# Patient Record
Sex: Female | Born: 1965 | Hispanic: No | Marital: Single | Smoking: Never smoker
Health system: Southern US, Community
[De-identification: ages and names within clinical notes are randomized; demographics above are authoritative.]

## PROBLEM LIST (undated history)

## (undated) DIAGNOSIS — Z5900 Homelessness unspecified: Secondary | ICD-10-CM

## (undated) DIAGNOSIS — K8020 Calculus of gallbladder without cholecystitis without obstruction: Secondary | ICD-10-CM

## (undated) DIAGNOSIS — F418 Other specified anxiety disorders: Secondary | ICD-10-CM

## (undated) DIAGNOSIS — I2699 Other pulmonary embolism without acute cor pulmonale: Secondary | ICD-10-CM

## (undated) DIAGNOSIS — K5792 Diverticulitis of intestine, part unspecified, without perforation or abscess without bleeding: Secondary | ICD-10-CM

## (undated) DIAGNOSIS — N809 Endometriosis, unspecified: Secondary | ICD-10-CM

## (undated) DIAGNOSIS — G4733 Obstructive sleep apnea (adult) (pediatric): Secondary | ICD-10-CM

## (undated) DIAGNOSIS — R569 Unspecified convulsions: Secondary | ICD-10-CM

## (undated) DIAGNOSIS — I10 Essential (primary) hypertension: Secondary | ICD-10-CM

## (undated) DIAGNOSIS — E669 Obesity, unspecified: Secondary | ICD-10-CM

## (undated) HISTORY — PX: CYSTOSCOPY: SUR368

## (undated) HISTORY — PX: COLONOSCOPY: SHX174

## (undated) HISTORY — PX: APPENDECTOMY: SHX54

## (undated) HISTORY — PX: ABDOMINAL HYSTERECTOMY: SHX81

---

## 2017-11-06 DIAGNOSIS — E782 Mixed hyperlipidemia: Secondary | ICD-10-CM | POA: Insufficient documentation

## 2017-11-08 ENCOUNTER — Other Ambulatory Visit: Payer: Self-pay | Admitting: Family Medicine

## 2017-11-08 DIAGNOSIS — Z1231 Encounter for screening mammogram for malignant neoplasm of breast: Secondary | ICD-10-CM

## 2017-11-21 ENCOUNTER — Ambulatory Visit
Admission: RE | Admit: 2017-11-21 | Discharge: 2017-11-21 | Disposition: A | Discharge status: Home / Self Care | Payer: 59 | Source: Ambulatory Visit | Attending: Family Medicine | Admitting: Family Medicine

## 2017-11-21 ENCOUNTER — Encounter (INDEPENDENT_AMBULATORY_CARE_PROVIDER_SITE_OTHER): Payer: Self-pay

## 2017-11-21 DIAGNOSIS — Z1231 Encounter for screening mammogram for malignant neoplasm of breast: Secondary | ICD-10-CM | POA: Insufficient documentation

## 2017-11-22 DIAGNOSIS — G4733 Obstructive sleep apnea (adult) (pediatric): Secondary | ICD-10-CM | POA: Insufficient documentation

## 2017-12-03 ENCOUNTER — Encounter: Payer: 59 | Attending: Family Medicine | Admitting: Dietician

## 2017-12-03 DIAGNOSIS — R635 Abnormal weight gain: Secondary | ICD-10-CM | POA: Diagnosis present

## 2017-12-03 NOTE — Patient Instructions (Addendum)
Work to establish 3 meals per day spaced 4-5 hours apart. If there is 6 hours between a meal, include a small snack.  Balance meals with protein, 2-4 servings of carbohydrate (starch, fruit, yogurt) and non-starchy vegetables. Use food guide plate as a guide.  Read labels for carbohydrate.  Be sure to look at the serving size on the package.

## 2017-12-03 NOTE — Progress Notes (Signed)
Medical Nutrition Therapy: Visit start time:1430 end time: 1530 Assessment:  Diagnosis: abnormal weight gain Past medical history: elevated cholesterol- T.Chol.-218; LDL- 142 Psychosocial issues/ stress concerns: Patient rates her stress as high related to her work. She denies depression.  Preferred learning method:  . Auditory . Visual . Hands-on Current weight: 272.2 lbs Height: 70 in Medications, supplements: no prescription meds; takes rosemary extract, flaxseed oil, salmon oil  Progress and evaluation:  Patient in for initial medical nutrition therapy appointment. 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 She reports a 20 lb weight gain in the past year. She gives a weight goal of 190 lbs which she weighed approximately 15 years ago. She eats a large breakfast at 5:30am such as 4 eggs, toast or 2 cups oatmeal and doesn't typically eat a lunch due to busy workload. She then eats dinner a 5:30 pm and acknowledges that portion control is difficult due to not eating during the day. She prepares dinner for the residents with examples of baked chicken, rice with vegetables or butternut squash spaghetti with red skin potatoes.She gave another example of 10 seasoned chicken wings and vegetables. Her evening snack is sometimes yogurt or something sweet such as 10 mini oreos. She states she drinks 50 oz water daily; 2 cups of coffee; soda-2x/week. Eats "out" 2 x per week.  Physical activity: Had been working out at [employer] 3 days/week for 1 hour; hurt her foot and rates pain as a "9". Has an orthopedic appointment 12/4.  Nutrition Care Education: Basic nutrition:  Instructed on food groups needed to meet basic nutrient needs. Weight control: Instructed on a meal plan to help improve eating habits including identifying carbohydrate foods, portion control and how to better balance  protein, carbohydrate and non starchy vegetables. Gave and reviewed meal/menu examples. Stressed need to take some time to eat lunch and discussed options. Encouraged to use meal plan as a guide for mindful eating verses over-restriction. Commended on present exercise habits and stressed exercise is a key in weight management.   Nutritional Diagnosis:  NB-1.1 Food and nutrition-related knowledge deficit As related to knowledge of how to balance nutrients at meals.  As evidenced by diet history..  Intervention: Work to establish 3 meals per day spaced 4-5 hours apart. If there is 6 hours between a meal, include a small snack.  Balance meals with protein, 2-4 servings of carbohydrate (starch, fruit, yogurt) and non-starchy vegetables. Use food guide plate as a guide.  Read labels for carbohydrate.  Be sure to look at the serving size on the package Education Materials given:  . Plate Planner . Food lists/ Planning A Balanced Meal . Sample meal pattern/ menus . Goals/ instructions . Quick and Healthy Meals . Balanced meal suggestions  Learner/ who was taught:  . Patient  Level of understanding: . Partial understanding; needs review/ practice  Demonstrated degree of understanding via:   Teach back Learning barriers: . None Willingness to learn/ readiness for change:  Eager; ready to make changes Monitoring and Evaluation:  Dietary intake, exercise, , and body weight. Will also focus more on guidelines for lowering cholesterol at follow-up visit.      follow up: 12/30/17 at 10:30am

## 2017-12-16 DIAGNOSIS — B351 Tinea unguium: Secondary | ICD-10-CM | POA: Insufficient documentation

## 2017-12-16 DIAGNOSIS — M2012 Hallux valgus (acquired), left foot: Secondary | ICD-10-CM | POA: Insufficient documentation

## 2017-12-16 DIAGNOSIS — L851 Acquired keratosis [keratoderma] palmaris et plantaris: Secondary | ICD-10-CM | POA: Insufficient documentation

## 2017-12-16 DIAGNOSIS — M2041 Other hammer toe(s) (acquired), right foot: Secondary | ICD-10-CM | POA: Insufficient documentation

## 2017-12-30 ENCOUNTER — Ambulatory Visit: Payer: 59 | Admitting: Dietician

## 2018-01-09 ENCOUNTER — Encounter: Payer: Self-pay | Admitting: Dietician

## 2018-02-21 ENCOUNTER — Telehealth: Payer: Self-pay | Admitting: Dietician

## 2018-02-21 NOTE — Telephone Encounter (Signed)
Patient called to schedule a follow-up visit, was seen 12/2017 for MNT. She reports attempting to schedule a follow-up earlier in the year, but never received a call back. Apologized for the error, and scheduled patient for 03/06/18.

## 2018-02-26 ENCOUNTER — Telehealth: Payer: Self-pay | Admitting: Dietician

## 2018-02-26 NOTE — Telephone Encounter (Signed)
Returned message from patient to reschedule her upcoming appointment on 03/06/18 to the following week. She rescheduled for 03/12/18 at 10:15am.

## 2018-03-06 ENCOUNTER — Ambulatory Visit: Payer: 59 | Admitting: Dietician

## 2018-03-12 ENCOUNTER — Encounter: Payer: 59 | Attending: Family Medicine | Admitting: Dietician

## 2018-03-12 ENCOUNTER — Encounter: Payer: Self-pay | Admitting: Dietician

## 2018-03-12 ENCOUNTER — Other Ambulatory Visit: Payer: Self-pay

## 2018-03-12 VITALS — Ht 72.0 in | Wt 275.0 lb

## 2018-03-12 DIAGNOSIS — R635 Abnormal weight gain: Secondary | ICD-10-CM | POA: Insufficient documentation

## 2018-03-12 NOTE — Progress Notes (Signed)
Medical Nutrition Therapy: Visit start time: 1015  end time: 1115  Assessment:  Diagnosis: obesity Medical history changes: no changes since previous visit on 12/03/17 per patient Psychosocial issues/ stress concerns: high stress level, work related  Current weight: 275.0lbs  Height: 6'0" Medications, supplement changes: no changes per patient  Progress and evaluation:   Patient reports continued high stress level mostly related to her work.   She has started using a C-pap machine as of last night and does feel more rested, hopeful it will continue.   Weight has increased by 3lbs since previous visit on 12/03/17. Patient has made some diet changes and feels ready to devote more effort to self-care now.   Physical activity: gym about 3x a week-- but not consistent; some PT exercises  Dietary Intake:  Usual eating pattern includes 2 meals and 1-2 snacks per day. Dining out frequency: not assessed today.  Breakfast: steel cut oatmeal with sm amount whole milk, cinnamon, less sugar; 1x a week feta cheese wrap or cheese danish at [employer]: none Lunch: sometimes none, or snack; usually not hungry Snack: none Supper: 5- 5:15pm- salad spinach with chicken or salmon with small amount dressing; occ burger (patients want burgers), usually fresh veg ie sweet potatoes Snack: snack pack of mini oreos or yogurt Activia Beverages: water with lemon; occasional coffee or green tea  Nutrition Care Education: Topics covered: weight control Basic nutrition: basic food groups, appropriate nutrient balance, appropriate meal and snack schedule, general nutrition guidelines    Weight control: benefits of weight control, calculated energy needs for weight loss at about 1800kcal daily and instructed on basic meal planning using plate method; importance of low fat and low sugar choices, appropriate food portions, benefits of tracking food intake, role of physical activity. Advanced nutrition: dining  out  Nutritional Diagnosis:  Thayer-3.3 Overweight/obesity As related to history of excess calories and inactivity, and stress.  As evidenced by patient wtih current BMI of 37.3 and patient report of diet and activity history.  Intervention:   Discussion and instruction as noted above.  Patient is making healthy food choices in general.   Established goals to further reduce calorie intake, with direction from patient.  Education Materials given:  . Plate Planner with food lists . Sample meal pattern/ menus . Goals/ instructions   Learner/ who was taught:  . Patient   Level of understanding: Marland Kitchen Verbalizes/ demonstrates competency   Demonstrated degree of understanding via:   Teach back Learning barriers: . None  Willingness to learn/ readiness for change: . Eager, change in progress  Monitoring and Evaluation:  Dietary intake, exercise, and body weight      follow up: 04/09/18

## 2018-03-12 NOTE — Patient Instructions (Addendum)
   Continue with making healthy vegetable choices, great job!  Monitor food portions especially starches; plan to eat 1/2 portions when dining out.  Include a protein source with each meal; try boiled egg(s)   Keep a food diary of foods and amounts eaten each day to stay aware of intake.   Include exercise when able -- increase time and intensity gradually as energy increases.   Keep working on stress management by planning some "me" time; take a few minutes during the day for deep breathing/ unwinding.

## 2018-04-09 ENCOUNTER — Ambulatory Visit: Payer: 59 | Admitting: Dietician

## 2018-05-27 DIAGNOSIS — R319 Hematuria, unspecified: Secondary | ICD-10-CM | POA: Insufficient documentation

## 2018-12-05 DIAGNOSIS — N644 Mastodynia: Secondary | ICD-10-CM | POA: Insufficient documentation

## 2018-12-05 DIAGNOSIS — N62 Hypertrophy of breast: Secondary | ICD-10-CM | POA: Insufficient documentation

## 2020-03-22 IMAGING — MG DIGITAL SCREENING BILATERAL MAMMOGRAM WITH TOMO AND CAD
8 of 15 series · 8 of 40 positions shown · non-contrast
Comparison: Previous exam(s).

ACR Breast Density Category a: The breast tissue is almost entirely
fatty.

CLINICAL DATA: Screening.

EXAM:
DIGITAL SCREENING BILATERAL MAMMOGRAM WITH TOMO AND CAD

[R MLO synth-2D (1 of 3)]
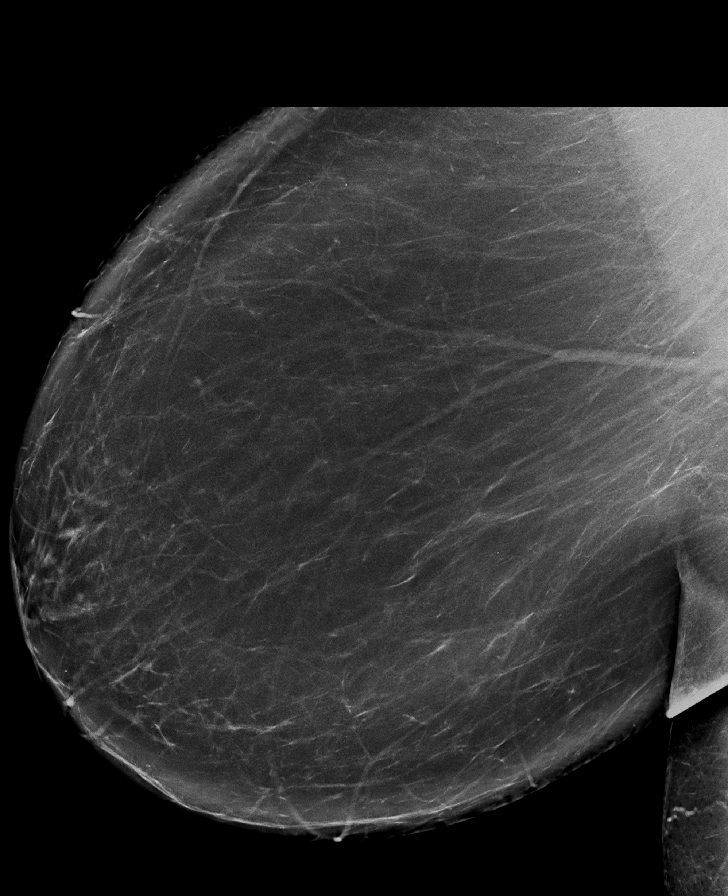

[L CC synth-2D]
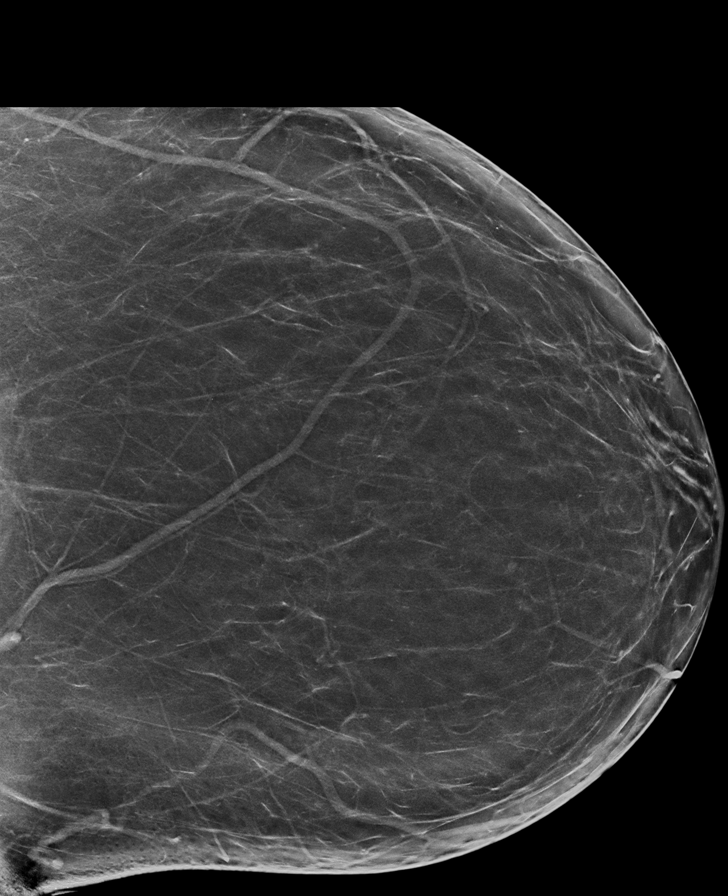

[L MLO synth-2D (1 of 3)]
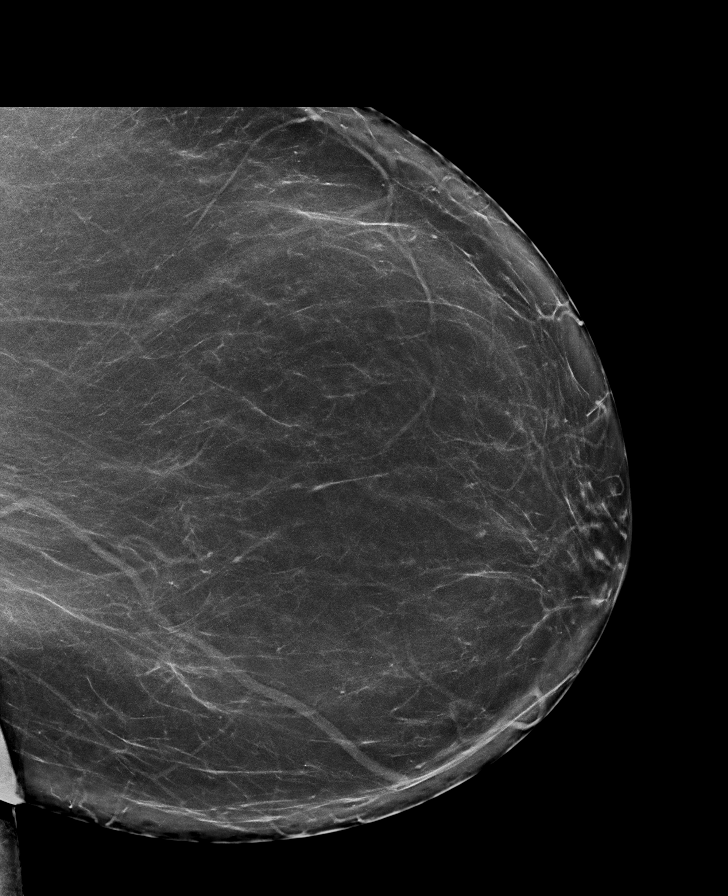

[L MLO synth-2D (2 of 3)]
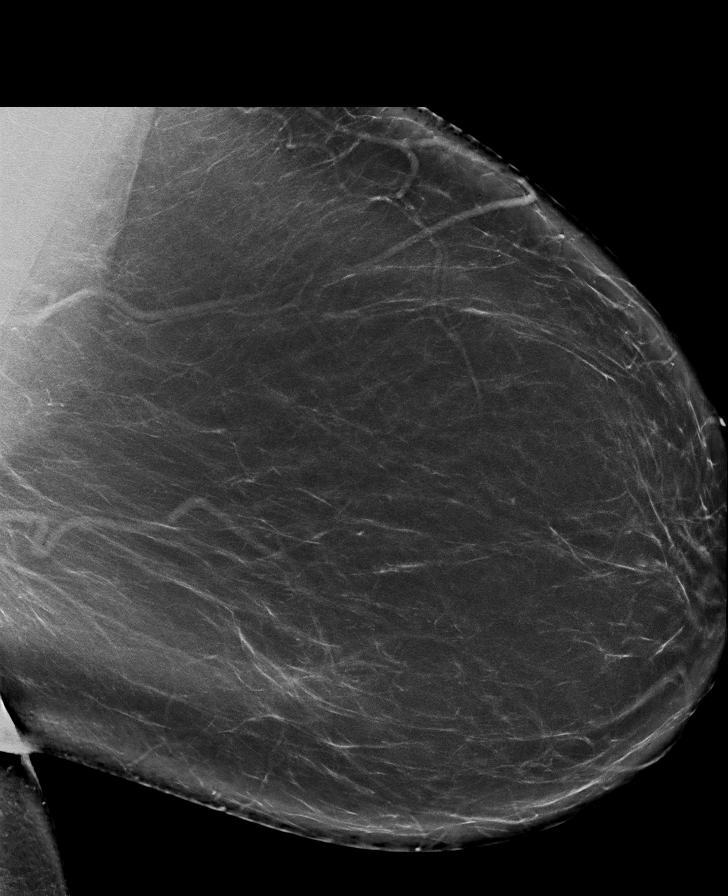

[R MLO synth-2D (2 of 3)]
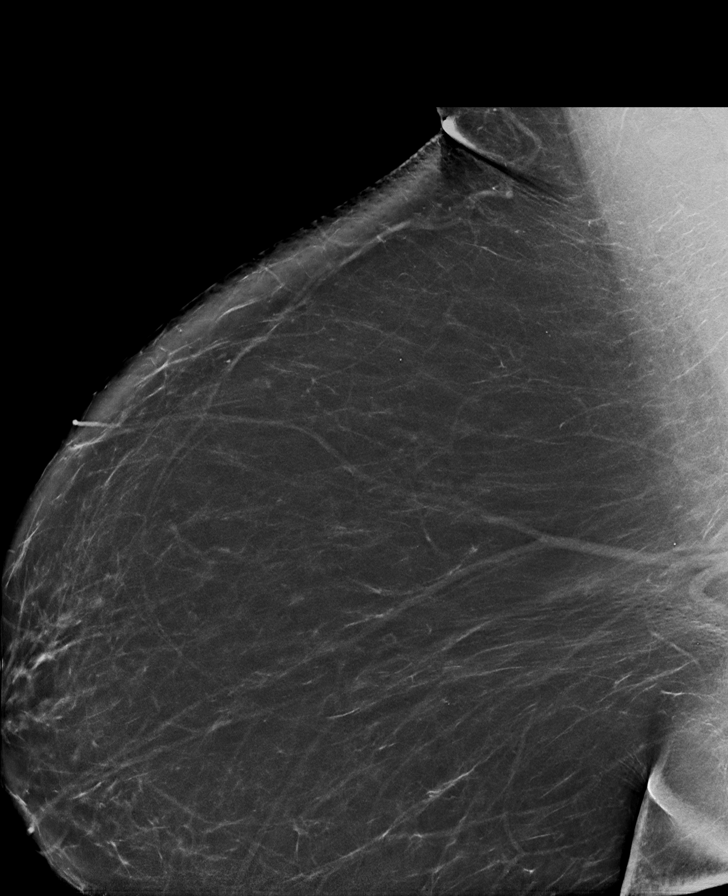

[R MLO synth-2D (3 of 3)]
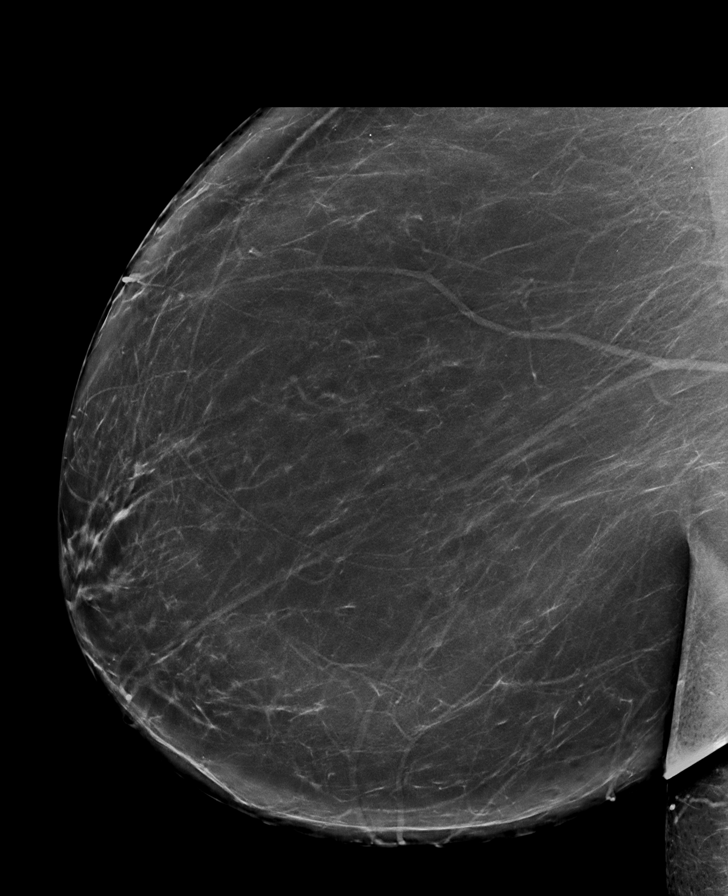

[L MLO synth-2D (3 of 3)]
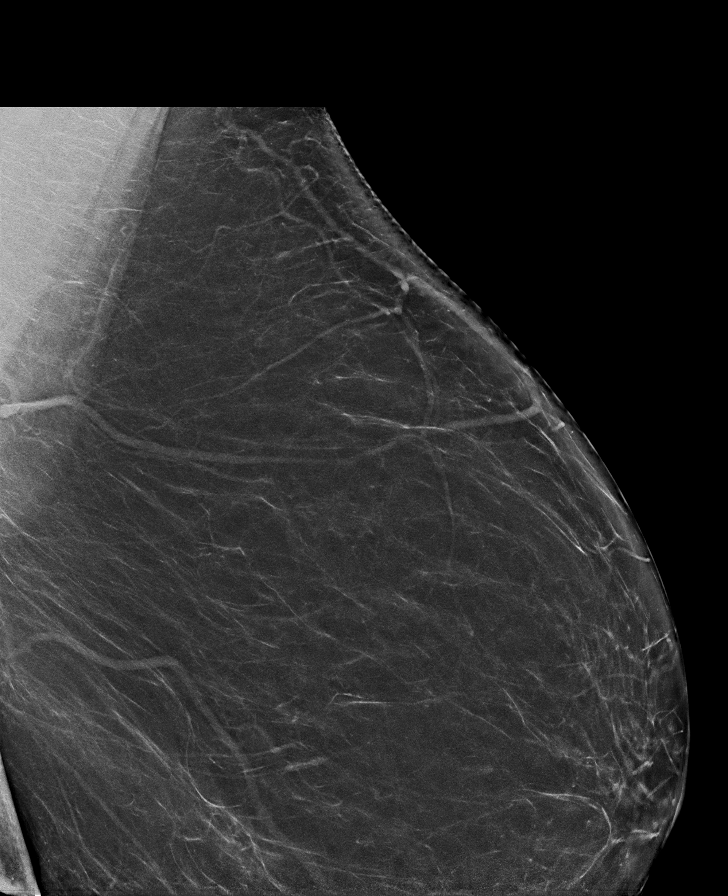

[R CC tomo · tomo slice 61/89.0]
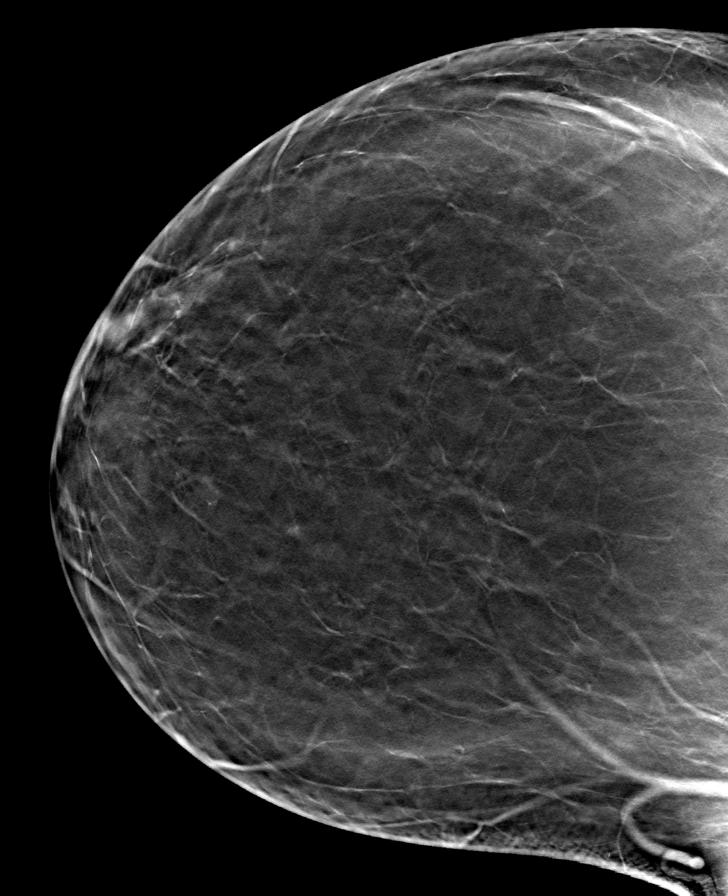

[8 of 40 positions shown; findings below may reference images not displayed]

FINDINGS: There are no findings suspicious for malignancy. Images were
processed with CAD.
IMPRESSION: No mammographic evidence of malignancy. A result letter of this
screening mammogram will be mailed directly to the patient.

RECOMMENDATION:
Screening mammogram in one year. (Code:[id number])

BI-RADS CATEGORY  1: Negative.

## 2021-12-13 ENCOUNTER — Other Ambulatory Visit: Payer: Self-pay | Admitting: Family Medicine

## 2021-12-13 ENCOUNTER — Encounter: Payer: Self-pay | Admitting: Family Medicine

## 2021-12-13 DIAGNOSIS — Z1231 Encounter for screening mammogram for malignant neoplasm of breast: Secondary | ICD-10-CM

## 2022-02-08 ENCOUNTER — Ambulatory Visit: Payer: 59

## 2022-03-08 ENCOUNTER — Ambulatory Visit: Payer: Self-pay

## 2022-03-26 ENCOUNTER — Ambulatory Visit: Payer: 59

## 2022-06-07 ENCOUNTER — Other Ambulatory Visit: Payer: Self-pay | Admitting: Family Medicine

## 2022-06-07 DIAGNOSIS — Z1231 Encounter for screening mammogram for malignant neoplasm of breast: Secondary | ICD-10-CM

## 2022-06-12 ENCOUNTER — Ambulatory Visit
Admission: RE | Admit: 2022-06-12 | Discharge: 2022-06-12 | Disposition: A | Discharge status: Home / Self Care | Payer: Medicaid Other | Source: Ambulatory Visit | Attending: Family Medicine | Admitting: Family Medicine

## 2022-06-12 DIAGNOSIS — Z1231 Encounter for screening mammogram for malignant neoplasm of breast: Secondary | ICD-10-CM

## 2022-10-08 ENCOUNTER — Ambulatory Visit (INDEPENDENT_AMBULATORY_CARE_PROVIDER_SITE_OTHER): Payer: Medicaid Other | Admitting: Urology

## 2022-10-08 ENCOUNTER — Encounter: Payer: Self-pay | Admitting: Urology

## 2022-10-08 VITALS — BP 130/81 | HR 77 | Ht 72.0 in | Wt 273.0 lb

## 2022-10-08 DIAGNOSIS — R319 Hematuria, unspecified: Secondary | ICD-10-CM | POA: Diagnosis not present

## 2022-10-08 NOTE — Addendum Note (Signed)
Addended by: Carolin Coy on: 10/08/2022 10:43 AM   Modules accepted: Orders

## 2022-10-08 NOTE — Progress Notes (Signed)
   Assessment: 1. Hematuria, unspecified type      Plan: Today I had a long discussion with the patient regarding microscopic hematuria and recommendations for evaluation including upper tract imaging and cystoscopy.  She agrees to proceed.  Schedule next available.  Chief Complaint: blood in urine  History of Present Illness:  Carmen Barr is a 57 y.o. female who is seen in consultation from Caffie Damme, MD ([facility]) for microhematuria. Recent UA > 50RBC No significant proteinuria; no h/o GH or prior urologic issues Nl renal function  Never smoker She is homeless- living in her car   Past Medical History:  No past medical history on file.  Past Surgical History:  Past Surgical History:  Procedure Laterality Date   ABDOMINAL HYSTERECTOMY      Allergies:  Allergies  Allergen Reactions   Percocet [Oxycodone-Acetaminophen]     Family History:  Family History  Problem Relation Age of Onset   Breast cancer Maternal Grandmother 23   Breast cancer Cousin        mat cousin    Social History:  Social History   Tobacco Use   Smoking status: Never   Smokeless tobacco: Never  Substance Use Topics   Alcohol use: Never    Review of symptoms:  Constitutional:  Negative for unexplained weight loss, night sweats, fever, chills ENT:  Negative for nose bleeds, sinus pain, painful swallowing CV:  Negative for chest pain, shortness of breath, exercise intolerance, palpitations, loss of consciousness Resp:  Negative for cough, wheezing, shortness of breath GI:  Negative for nausea, vomiting, diarrhea, bloody stools GU:  Positives noted in HPI; otherwise negative for gross hematuria, dysuria, urinary incontinence Neuro:  Negative for seizures, poor balance, limb weakness, slurred speech Psych:  Negative for lack of energy, depression, anxiety Endocrine:  Negative for polydipsia, polyuria, symptoms of hypoglycemia (dizziness, hunger, sweating) Hematologic:   Negative for anemia, purpura, petechia, prolonged or excessive bleeding, use of anticoagulants  Allergic:  Negative for difficulty breathing or choking as a result of exposure to anything; no shellfish allergy; no allergic response (rash/itch) to materials, foods  Physical exam: BP 130/81   Pulse 77   Ht 6' (1.829 m)   Wt 273 lb (123.8 kg)   BMI 37.03 kg/m  GENERAL APPEARANCE:  Well appearing, well developed, well nourished, NAD

## 2022-10-10 ENCOUNTER — Ambulatory Visit (HOSPITAL_BASED_OUTPATIENT_CLINIC_OR_DEPARTMENT_OTHER)
Admission: RE | Admit: 2022-10-10 | Discharge: 2022-10-10 | Disposition: A | Discharge status: Home / Self Care | Payer: Medicaid Other | Source: Ambulatory Visit | Attending: Urology | Admitting: Urology

## 2022-10-10 DIAGNOSIS — R319 Hematuria, unspecified: Secondary | ICD-10-CM | POA: Diagnosis present

## 2022-10-10 MED ORDER — IOHEXOL 300 MG/ML  SOLN
125.0000 mL | Freq: Once | INTRAMUSCULAR | Status: AC | PRN
  Administered 2022-10-10: 125 mL via INTRAVENOUS

## 2022-10-11 ENCOUNTER — Other Ambulatory Visit: Payer: Self-pay

## 2022-10-11 ENCOUNTER — Emergency Department (HOSPITAL_BASED_OUTPATIENT_CLINIC_OR_DEPARTMENT_OTHER)
Admission: EM | Admit: 2022-10-11 | Discharge: 2022-10-11 | Disposition: A | Discharge status: Home / Self Care | Payer: Medicaid Other | Attending: Emergency Medicine | Admitting: Emergency Medicine

## 2022-10-11 ENCOUNTER — Encounter (HOSPITAL_BASED_OUTPATIENT_CLINIC_OR_DEPARTMENT_OTHER): Payer: Self-pay

## 2022-10-11 DIAGNOSIS — R1032 Left lower quadrant pain: Secondary | ICD-10-CM | POA: Insufficient documentation

## 2022-10-11 DIAGNOSIS — Z91041 Radiographic dye allergy status: Secondary | ICD-10-CM

## 2022-10-11 DIAGNOSIS — T508X5A Adverse effect of diagnostic agents, initial encounter: Secondary | ICD-10-CM | POA: Insufficient documentation

## 2022-10-11 DIAGNOSIS — Z20822 Contact with and (suspected) exposure to covid-19: Secondary | ICD-10-CM | POA: Insufficient documentation

## 2022-10-11 DIAGNOSIS — R600 Localized edema: Secondary | ICD-10-CM | POA: Diagnosis not present

## 2022-10-11 DIAGNOSIS — R197 Diarrhea, unspecified: Secondary | ICD-10-CM | POA: Diagnosis not present

## 2022-10-11 DIAGNOSIS — R112 Nausea with vomiting, unspecified: Secondary | ICD-10-CM | POA: Insufficient documentation

## 2022-10-11 LAB — I-STAT CHEM 8, ED
BUN: 15 mg/dL (ref 6–20)
Calcium, Ion: 1.22 mmol/L (ref 1.15–1.40)
Chloride: 102 mmol/L (ref 98–111)
Creatinine, Ser: 0.9 mg/dL (ref 0.44–1.00)
Glucose, Bld: 105 mg/dL — ABNORMAL HIGH (ref 70–99)
HCT: 36 % (ref 36.0–46.0)
Hemoglobin: 12.2 g/dL (ref 12.0–15.0)
Potassium: 3.8 mmol/L (ref 3.5–5.1)
Sodium: 141 mmol/L (ref 135–145)
TCO2: 27 mmol/L (ref 22–32)

## 2022-10-11 LAB — URINALYSIS, W/ REFLEX TO CULTURE (INFECTION SUSPECTED)
Bilirubin Urine: NEGATIVE
Glucose, UA: NEGATIVE mg/dL
Ketones, ur: NEGATIVE mg/dL
Leukocytes,Ua: NEGATIVE
Nitrite: NEGATIVE
Protein, ur: NEGATIVE mg/dL
Specific Gravity, Urine: 1.015 (ref 1.005–1.030)
pH: 6.5 (ref 5.0–8.0)

## 2022-10-11 LAB — CBC WITH DIFFERENTIAL/PLATELET
Abs Immature Granulocytes: 0.02 10*3/uL (ref 0.00–0.07)
Basophils Absolute: 0 10*3/uL (ref 0.0–0.1)
Basophils Relative: 0 %
Eosinophils Absolute: 0.2 10*3/uL (ref 0.0–0.5)
Eosinophils Relative: 2 %
HCT: 36.1 % (ref 36.0–46.0)
Hemoglobin: 12.2 g/dL (ref 12.0–15.0)
Immature Granulocytes: 0 %
Lymphocytes Relative: 23 %
Lymphs Abs: 1.6 10*3/uL (ref 0.7–4.0)
MCH: 30.7 pg (ref 26.0–34.0)
MCHC: 33.8 g/dL (ref 30.0–36.0)
MCV: 90.9 fL (ref 80.0–100.0)
Monocytes Absolute: 0.4 10*3/uL (ref 0.1–1.0)
Monocytes Relative: 6 %
Neutro Abs: 4.6 10*3/uL (ref 1.7–7.7)
Neutrophils Relative %: 69 %
Platelets: 306 10*3/uL (ref 150–400)
RBC: 3.97 MIL/uL (ref 3.87–5.11)
RDW: 13.2 % (ref 11.5–15.5)
WBC: 6.8 10*3/uL (ref 4.0–10.5)
nRBC: 0 % (ref 0.0–0.2)

## 2022-10-11 LAB — RESP PANEL BY RT-PCR (RSV, FLU A&B, COVID)  RVPGX2
Influenza A by PCR: NEGATIVE
Influenza B by PCR: NEGATIVE
Resp Syncytial Virus by PCR: NEGATIVE
SARS Coronavirus 2 by RT PCR: NEGATIVE

## 2022-10-11 LAB — BRAIN NATRIURETIC PEPTIDE: B Natriuretic Peptide: 20.8 pg/mL (ref 0.0–100.0)

## 2022-10-11 MED ORDER — DEXAMETHASONE SODIUM PHOSPHATE 10 MG/ML IJ SOLN
10.0000 mg | Freq: Once | INTRAMUSCULAR | Status: AC
  Administered 2022-10-11: 10 mg via INTRAVENOUS
  Filled 2022-10-11: qty 1

## 2022-10-11 MED ORDER — ACETAMINOPHEN 500 MG PO TABS
1000.0000 mg | ORAL_TABLET | Freq: Once | ORAL | Status: AC
  Administered 2022-10-11: 1000 mg via ORAL
  Filled 2022-10-11: qty 2

## 2022-10-11 NOTE — ED Provider Notes (Signed)
[facility] Provider Note   CSN: [id number] Arrival date & time: 10/11/22  4132     History  Chief Complaint  Patient presents with   Leg Swelling   Emesis    Carmen Barr is a 57 y.o. female.  [name] with a history of prior appendectomy, abdominal hysterectomy, ongoing microscopic hematuria who is presenting today with multiple complaints.  Patient reports that yesterday she underwent a CT scan with and without contrast for workup of microscopic hematuria.  She did not leave the facility until 6 or 7 last night reports waking up at 1 AM feeling generally unwell.  She had nausea, vomiting, diarrhea.  While she was vomiting she also felt like she could not catch her breath and noticed she also had significant swelling in her lower extremities.  She also reports feeling hot all over but denies cough or congestion.  She has had some ongoing abdominal pain in the left lower quadrant but reports that approximately 2 weeks ago she was having urgency, frequency and went to urgent care and was diagnosed with a UTI.  She received a IM injection of antibiotics but reports she could never pick up the prescription because she is currently living in her car and does not have income and could not afford it.  She continues to have urgency with urinating.  She has never had a CT with contrast and was concerned that she may be having an allergic reaction.  The history is provided by the patient.  Emesis      Home Medications Prior to Admission medications   Medication Sig Start Date End Date Taking? Authorizing Provider  ciclopirox [facility]) 8 % solution  03/02/18   [provider]  citalopram (CELEXA) 20 MG tablet citalopram 20 mg tablet 03/03/18   [provider]  dexamethasone (DECADRON) 4 MG/ML injection  01/28/18   [provider]  diclofenac sodium (VOLTAREN) 1 % GEL  03/11/18   [provider]  Flaxseed, Linseed, (FLAXSEED OIL PO) Use    [provider]  fluticasone (FLONASE) 50 MCG/ACT nasal spray fluticasone propionate 50 mcg/actuation nasal spray,suspension    [provider]      Allergies    Percocet [oxycodone-acetaminophen]    Review of Systems   Review of Systems  Gastrointestinal:  Positive for vomiting.    Physical Exam Updated Vital Signs BP 129/88   Pulse (!) 55   Temp 98.6 F (37 C) (Oral)   Resp 17   Ht 6' (1.829 m)   Wt 123.8 kg   SpO2 95%   BMI 37.02 kg/m  Physical Exam Vitals and nursing note reviewed.  Constitutional:      General: She is not in acute distress.    Appearance: She is well-developed.  HENT:     Head: Normocephalic and atraumatic.     Mouth/Throat:     Mouth: Mucous membranes are dry.  Eyes:     Pupils: Pupils are equal, round, and reactive to light.  Neck:     Comments: No JVD Cardiovascular:     Rate and Rhythm: Normal rate and regular rhythm.     Heart sounds: Normal heart sounds. No murmur heard.    No friction rub.  Pulmonary:     Effort: Pulmonary effort is normal.     Breath sounds: Normal breath sounds. No wheezing or rales.  Abdominal:     General: Bowel sounds are normal. There  is no distension.     Palpations: Abdomen is soft.     Tenderness: There is abdominal tenderness in the left lower quadrant. There is no right CVA tenderness, left CVA tenderness, guarding or rebound.  Musculoskeletal:        General: No tenderness. Normal range of motion.     Cervical back: Normal range of motion and neck supple.     Right lower leg: Edema present.     Left lower leg: Edema present.     Comments: Nonpitting edema noted in both ankles and feet  Skin:    General: Skin is warm and dry.     Findings: No rash.  Neurological:     Mental Status: She is alert and oriented to person, place, and time. Mental status is at baseline.     Cranial Nerves: No cranial nerve deficit.  Psychiatric:         Behavior: Behavior normal.     ED Results / Procedures / Treatments   Labs (all labs ordered are listed, but only abnormal results are displayed) Labs Reviewed  URINALYSIS, W/ REFLEX TO CULTURE (INFECTION SUSPECTED) - Abnormal; Notable for the following components:      Result Value   Hgb urine dipstick SMALL (*)    Bacteria, UA RARE (*)    All other components within normal limits  I-STAT CHEM 8, ED - Abnormal; Notable for the following components:   Glucose, Bld 105 (*)    All other components within normal limits  RESP PANEL BY RT-PCR (RSV, FLU A&B, COVID)  RVPGX2  CBC WITH DIFFERENTIAL/PLATELET  BRAIN NATRIURETIC PEPTIDE    EKG None  Radiology CT HEMATURIA WORKUP  Result Date: 10/11/2022 CLINICAL DATA:  57 year old female with microscopic hematuria, in the emergency department this morning with abdominal pain. EXAM: CT ABDOMEN AND PELVIS WITHOUT AND WITH CONTRAST TECHNIQUE: Multidetector CT imaging of the abdomen and pelvis was performed following the standard protocol before and following the bolus administration of intravenous contrast. RADIATION DOSE REDUCTION: This exam was performed according to the departmental dose-optimization program which includes automated exposure control, adjustment of the mA and/or kV according to patient size and/or use of iterative reconstruction technique. CONTRAST:  OMNIPAQUE IOHEXOL 300 MG/ML  SOLN COMPARISON:  None Available. FINDINGS: Lower chest: Cardiac size at the upper limits of normal. No pericardial or pleural effusion. Lung bases are negative. Hepatobiliary: Negative liver and gallbladder. Pancreas: Negative. Spleen: Negative. Adrenals/Urinary Tract: Normal adrenal glands. Pre contrast images with no evidence of nephrolithiasis. Symmetric renal enhancement on the nephrographic phase. No hydronephrosis. No evidence of renal inflammation. Symmetric renal contrast excretion on the delayed images. Unremarkable renal collecting  system. Contrast throughout both ureters to the bladder. Normal ureters. Multiple pelvic phleboliths. No calcification within the bladder. There is mild nonspecific bladder wall thickening initially with the urinary bladder fairly decompressed. Delayed post excretory images appear more normal. Stomach/Bowel: Moderately redundant large bowel with mild retained stool throughout. Appendix is not identified and may be diminutive or absent. No large bowel inflammation. Nondilated small bowel, stomach and duodenum. No free air, free fluid, mesenteric inflammation identified. Vascular/Lymphatic: Major arterial structures in the abdomen and pelvis appear patent and normal. No calcified atherosclerosis or lymphadenopathy identified. Patent portal venous system. Reproductive: Surgically absent uterus, diminutive or absent ovaries. Other: No pelvis free fluid. Musculoskeletal: Lower thoracic and lumbar spine degeneration with multilevel vacuum disc, endplate spurring. Moderate and severe lower lumbar facet degeneration. No acute osseous abnormality identified. 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 No acute osseous abnormality identified. IMPRESSION: 1. Negative CT appearance of the urinary system aside from mild nonspecific bladder wall thickening. No renal mass, urinary calculus, or obstructive uropathy. Consider the possibility of cystitis or UTI. 2. No other acute or inflammatory process identified in the abdomen or pelvis. Retained stool in redundant large bowel. Advanced degeneration in the spine. Electronically Signed   By: Odessa Fleming M.D.   On: 10/11/2022 08:00    Procedures Procedures    Medications Ordered in ED Medications  dexamethasone (DECADRON) injection 10 mg (10 mg Intravenous Given 10/11/22 0737)  acetaminophen (TYLENOL) tablet 1,000 mg (1,000 mg Oral Given 10/11/22 6213)    ED Course/ Medical Decision Making/ A&P                                 Medical Decision Making Amount and/or  Complexity of Data Reviewed Labs: ordered. Decision-making details documented in ED Course. Radiology:  Decision-making details documented in ED Course.  Risk OTC drugs. Prescription drug management.   Pt presenting today with a complaint that caries a high risk for morbidity and mortality. Here today with multiple complaints.  Concern for possible reaction to IV contrast given history versus pyelonephritis as her urinary tract infection was only treated with 1 IM dose of medication versus diverticulitis.  Patient is not displaying any signs of anaphylaxis at this time as she has no respiratory component here, no wheezing, oropharyngeal swelling, rash and blood pressure is normal.  Also concern for possible viral etiology.  No history of cardiac disease and lower suspicion for ACS, CHF.  Patient's edema is nonpitting no evidence of JVD.  Patient given Decadron due to concern for possible allergic reaction.  She reports the nausea has subsided.  Will discuss with radiology to see if her images from yesterday can be read to ensure no evidence of pyelonephritis or diverticulitis on scan.  When I evaluated the scan I did not appreciate any renal stones.  Will repeat urine to ensure no ongoing UTI.  I independently interpreted patient's labs and CBC, Chem-8 without acute findings today.  Lower suspicion for an acute cardiac process at this time.    9:14 AM Spoke with radiology who graciously read patient's outpatient film and at this time there is no acute findings on her CT.  UA without evidence of infection at this time.  Findings discussed with the patient.  She is starting to feel much better.  No indication for further testing or medications at this time.  Patient is stable for discharge.  She will follow-up with Dr. Margo Aye in the future for any ongoing testing for her microscopic hematuria.        Final Clinical Impression(s) / ED Diagnoses Final diagnoses:  Nausea vomiting and diarrhea   Allergic to IV contrast    Rx / DC Orders ED Discharge Orders     None         Gwyneth Sprout, MD 10/11/22 [phone]

## 2022-10-11 NOTE — ED Notes (Signed)
Discharge paperwork reviewed entirely with patient, including follow up care. Pain was under control. No prescriptions were called in, but all questions were addressed.  Pt verbalized understanding as well as all parties involved. No questions or concerns voiced at the time of discharge. No acute distress noted.   Pt ambulated out to PVA without incident or assistance.  

## 2022-10-11 NOTE — ED Triage Notes (Signed)
Pt reports she had contrast dye yesterday for a CT scan and she since has developed BLE swelling, SOB, N/V, and feeling bad. Pt states she had CT abd/pelvis and has a current UTI. Pt endorses LT flank and abd pain.

## 2022-10-11 NOTE — Discharge Instructions (Addendum)
All the blood work and urine looked normal today.  Also your CAT scan looked good there was no sign of kidney stones or diverticulitis.  You could have been having a reaction to the dye that you received yesterday and you are given steroids for that.  Today would be a good idea to elevate your feet when you can, bland diet and return for any worsening issues.

## 2022-11-06 ENCOUNTER — Other Ambulatory Visit: Payer: Medicaid Other | Admitting: Urology

## 2022-11-15 ENCOUNTER — Ambulatory Visit: Payer: Medicaid Other | Admitting: Urology

## 2022-11-15 VITALS — BP 142/91 | HR 64

## 2022-11-15 DIAGNOSIS — R3129 Other microscopic hematuria: Secondary | ICD-10-CM | POA: Diagnosis not present

## 2022-11-15 DIAGNOSIS — R319 Hematuria, unspecified: Secondary | ICD-10-CM

## 2022-11-15 LAB — URINALYSIS, ROUTINE W REFLEX MICROSCOPIC
Bilirubin, UA: NEGATIVE
Glucose, UA: NEGATIVE
Ketones, UA: NEGATIVE
Leukocytes,UA: NEGATIVE
Nitrite, UA: NEGATIVE
Protein,UA: NEGATIVE
Specific Gravity, UA: 1.02 (ref 1.005–1.030)
Urobilinogen, Ur: 0.2 mg/dL (ref 0.2–1.0)
pH, UA: 7 (ref 5.0–7.5)

## 2022-11-15 LAB — MICROSCOPIC EXAMINATION

## 2022-11-15 MED ORDER — SULFAMETHOXAZOLE-TRIMETHOPRIM 800-160 MG PO TABS
1.0000 | ORAL_TABLET | Freq: Once | ORAL | Status: AC
Start: 2022-11-15 — End: 2022-11-15
  Administered 2022-11-15: 1 via ORAL

## 2022-11-15 NOTE — Addendum Note (Signed)
Addended by: Malva Cogan L on: 11/15/2022 11:30 AM   Modules accepted: Orders

## 2022-11-15 NOTE — Progress Notes (Signed)
   Assessment: 1. Hematuria, unspecified type     Plan: Evaluation for microscopic hematuria does not reveal any significant GU pathology with negative upper tract imaging and cystoscopy.,  Patient will follow-up in 6 months for repeat urine check.  Consider hematuria profile if significant microhematuria still present.  Chief Complaint: hematuria  HPI: Carmen Barr is a 57 y.o. female who presents for continued evaluation of microhematuria. Patient initially referred from Caffie Damme, MD ([facility]) for microhematuria. Recent UA > 50RBC No significant proteinuria; no h/o GH or prior urologic issues Nl renal function   Never smoker She is homeless- living in her car  Today I reviewed the CT findings with her. Hematuria evaluation-- CT-heme protocol 10/10/2022-- IMPRESSION: 1. Negative CT appearance of the urinary system aside from mild nonspecific bladder wall thickening. No renal mass, urinary calculus, or obstructive uropathy. Consider the possibility of cystitis or UTI.   2. No other acute or inflammatory process identified in the abdomen or pelvis. Retained stool in redundant large bowel. Advanced degeneration in the spine.  Portions of the above documentation were copied from a prior visit for review purposes only.  Allergies: Allergies  Allergen Reactions   Percocet [Oxycodone-Acetaminophen] Itching    Itching     PMH: No past medical history on file.  PSH: Past Surgical History:  Procedure Laterality Date   ABDOMINAL HYSTERECTOMY     APPENDECTOMY      SH: Social History   Tobacco Use   Smoking status: Never   Smokeless tobacco: Never  Vaping Use   Vaping status: Never Used  Substance Use Topics   Alcohol use: Never   Drug use: Never    ROS: Constitutional:  Negative for fever, chills, weight loss CV: Negative for chest pain, previous MI, hypertension Respiratory:  Negative for shortness of breath, wheezing, sleep apnea, frequent  cough GI:  Negative for nausea, vomiting, bloody stool, GERD  PE: Vitals:   11/15/22 1041  BP: (!) 142/91  Pulse: 64    GENERAL APPEARANCE:  Well appearing, well developed, well nourished, NAD    Results: UA shows continued microscopic hematuria   PROCEDURE:  FEMALE CYSTOSCOPY  INDICATION:  MICROHEMATURIA  DESCRIPTION OF PROCEDURE: The patient was brought to the procedure room where she was correctly identified and the procedure again reviewed with her and informed consent obtained.  Patient was then positioned in the modified dorsolithotomy position and her external genitalia were prepped and draped in the usual fashion.  Flexible cystoscopy was then performed.  On careful inspection of the bladder the ureteral openings appeared normal.  The bladder was visualized in its entirety and there were no focal mucosal lesions.  The bladder looked quite normal.  Procedure was well-tolerated

## 2022-12-15 ENCOUNTER — Other Ambulatory Visit: Payer: Self-pay

## 2022-12-15 ENCOUNTER — Emergency Department: Payer: Medicaid Other

## 2022-12-15 ENCOUNTER — Observation Stay
Admission: EM | Admit: 2022-12-15 | Discharge: 2022-12-17 | Disposition: A | Discharge status: Home / Self Care | Payer: Medicaid Other | Attending: Internal Medicine | Admitting: Internal Medicine

## 2022-12-15 ENCOUNTER — Observation Stay: Payer: Medicaid Other

## 2022-12-15 DIAGNOSIS — F32A Depression, unspecified: Secondary | ICD-10-CM | POA: Insufficient documentation

## 2022-12-15 DIAGNOSIS — R0789 Other chest pain: Secondary | ICD-10-CM | POA: Diagnosis present

## 2022-12-15 DIAGNOSIS — I10 Essential (primary) hypertension: Secondary | ICD-10-CM | POA: Diagnosis present

## 2022-12-15 DIAGNOSIS — Z6839 Body mass index (BMI) 39.0-39.9, adult: Secondary | ICD-10-CM | POA: Diagnosis not present

## 2022-12-15 DIAGNOSIS — I2609 Other pulmonary embolism with acute cor pulmonale: Principal | ICD-10-CM | POA: Insufficient documentation

## 2022-12-15 DIAGNOSIS — E669 Obesity, unspecified: Secondary | ICD-10-CM | POA: Diagnosis not present

## 2022-12-15 DIAGNOSIS — E876 Hypokalemia: Secondary | ICD-10-CM | POA: Diagnosis not present

## 2022-12-15 DIAGNOSIS — Z5900 Homelessness unspecified: Secondary | ICD-10-CM

## 2022-12-15 DIAGNOSIS — Z7951 Long term (current) use of inhaled steroids: Secondary | ICD-10-CM | POA: Insufficient documentation

## 2022-12-15 DIAGNOSIS — F418 Other specified anxiety disorders: Secondary | ICD-10-CM | POA: Diagnosis not present

## 2022-12-15 DIAGNOSIS — I2699 Other pulmonary embolism without acute cor pulmonale: Principal | ICD-10-CM | POA: Diagnosis present

## 2022-12-15 HISTORY — DX: Essential (primary) hypertension: I10

## 2022-12-15 HISTORY — DX: Obesity, unspecified: E66.9

## 2022-12-15 HISTORY — DX: Other specified anxiety disorders: F41.8

## 2022-12-15 LAB — BASIC METABOLIC PANEL
Anion gap: 13 (ref 5–15)
BUN: 18 mg/dL (ref 6–20)
CO2: 27 mmol/L (ref 22–32)
Calcium: 9.2 mg/dL (ref 8.9–10.3)
Chloride: 97 mmol/L — ABNORMAL LOW (ref 98–111)
Creatinine, Ser: 0.86 mg/dL (ref 0.44–1.00)
GFR, Estimated: >60 mL/min (ref >60)
Glucose, Bld: 120 mg/dL — ABNORMAL HIGH (ref 70–99)
Potassium: 3.2 mmol/L — ABNORMAL LOW (ref 3.5–5.1)
Sodium: 137 mmol/L (ref 135–145)

## 2022-12-15 LAB — URINALYSIS, W/ REFLEX TO CULTURE (INFECTION SUSPECTED)
Bacteria, UA: NONE SEEN
Bilirubin Urine: NEGATIVE
Glucose, UA: NEGATIVE mg/dL
Ketones, ur: NEGATIVE mg/dL
Leukocytes,Ua: NEGATIVE
Nitrite: NEGATIVE
Protein, ur: NEGATIVE mg/dL
Specific Gravity, Urine: 1.006 (ref 1.005–1.030)
pH: 6 (ref 5.0–8.0)

## 2022-12-15 LAB — CBC
HCT: 37.2 % (ref 36.0–46.0)
Hemoglobin: 12.5 g/dL (ref 12.0–15.0)
MCH: 30.6 pg (ref 26.0–34.0)
MCHC: 33.6 g/dL (ref 30.0–36.0)
MCV: 91.2 fL (ref 80.0–100.0)
Platelets: 271 10*3/uL (ref 150–400)
RBC: 4.08 MIL/uL (ref 3.87–5.11)
RDW: 13.2 % (ref 11.5–15.5)
WBC: 6.4 10*3/uL (ref 4.0–10.5)
nRBC: 0 % (ref 0.0–0.2)

## 2022-12-15 LAB — HEPATIC FUNCTION PANEL
ALT: 30 U/L (ref 0–44)
AST: 27 U/L (ref 15–41)
Albumin: 3.9 g/dL (ref 3.5–5.0)
Alkaline Phosphatase: 93 U/L (ref 38–126)
Bilirubin, Direct: <0.1 mg/dL (ref 0.0–0.2)
Total Bilirubin: 0.6 mg/dL (ref <1.2)
Total Protein: 7 g/dL (ref 6.5–8.1)

## 2022-12-15 LAB — MAGNESIUM: Magnesium: 2.1 mg/dL (ref 1.7–2.4)

## 2022-12-15 LAB — PROTIME-INR
INR: 1.1 (ref 0.8–1.2)
Prothrombin Time: 14.5 s (ref 11.4–15.2)

## 2022-12-15 LAB — TROPONIN I (HIGH SENSITIVITY)
Troponin I (High Sensitivity): 2 ng/L (ref <18)
Troponin I (High Sensitivity): 4 ng/L (ref <18)

## 2022-12-15 LAB — LIPASE, BLOOD: Lipase: 37 U/L (ref 11–51)

## 2022-12-15 LAB — D-DIMER, QUANTITATIVE: D-Dimer, Quant: 2.09 ug{FEU}/mL — ABNORMAL HIGH (ref 0.00–0.50)

## 2022-12-15 LAB — APTT: aPTT: 30 s (ref 24–36)

## 2022-12-15 MED ORDER — HEPARIN BOLUS VIA INFUSION
6000.0000 [IU] | Freq: Once | INTRAVENOUS | Status: AC
  Administered 2022-12-15: 6000 [IU] via INTRAVENOUS
  Filled 2022-12-15: qty 6000

## 2022-12-15 MED ORDER — ACETAMINOPHEN 325 MG PO TABS: 650.0000 mg | ORAL_TABLET | Freq: Four times a day (QID) | ORAL | Status: DC | PRN

## 2022-12-15 MED ORDER — POTASSIUM CHLORIDE CRYS ER 20 MEQ PO TBCR
40.0000 meq | EXTENDED_RELEASE_TABLET | Freq: Once | ORAL | Status: AC
  Administered 2022-12-15: 40 meq via ORAL
  Filled 2022-12-15: qty 2

## 2022-12-15 MED ORDER — TRAMADOL HCL 50 MG PO TABS: 50.0000 mg | ORAL_TABLET | Freq: Four times a day (QID) | ORAL | Status: DC | PRN

## 2022-12-15 MED ORDER — MORPHINE SULFATE (PF) 2 MG/ML IV SOLN: 2.0000 mg | INTRAVENOUS | Status: DC | PRN

## 2022-12-15 MED ORDER — HYDROCODONE-ACETAMINOPHEN 5-325 MG PO TABS
1.0000 | ORAL_TABLET | Freq: Four times a day (QID) | ORAL | Status: DC | PRN
  Administered 2022-12-16 (×2): 1 via ORAL
  Filled 2022-12-15 (×2): qty 1

## 2022-12-15 MED ORDER — ONDANSETRON HCL 4 MG/2ML IJ SOLN: 4.0000 mg | Freq: Three times a day (TID) | INTRAMUSCULAR | Status: DC | PRN

## 2022-12-15 MED ORDER — ALBUTEROL SULFATE (2.5 MG/3ML) 0.083% IN NEBU: 2.5000 mg | INHALATION_SOLUTION | RESPIRATORY_TRACT | Status: DC | PRN

## 2022-12-15 MED ORDER — DM-GUAIFENESIN ER 30-600 MG PO TB12: 1.0000 | ORAL_TABLET | Freq: Two times a day (BID) | ORAL | Status: DC | PRN

## 2022-12-15 MED ORDER — HEPARIN (PORCINE) 25000 UT/250ML-% IV SOLN
1600.0000 [IU]/h | INTRAVENOUS | Status: DC
Start: 2022-12-15 — End: 2022-12-16
  Administered 2022-12-15 – 2022-12-16 (×2): 1600 [IU]/h via INTRAVENOUS
  Filled 2022-12-15 (×2): qty 250

## 2022-12-15 MED ORDER — IOHEXOL 350 MG/ML SOLN
80.0000 mL | Freq: Once | INTRAVENOUS | Status: AC | PRN
  Administered 2022-12-15: 75 mL via INTRAVENOUS

## 2022-12-15 MED ORDER — HYDROMORPHONE HCL 1 MG/ML IJ SOLN: 1.0000 mg | INTRAMUSCULAR | Status: DC | PRN

## 2022-12-15 NOTE — ED Provider Notes (Addendum)
[facility] Provider Note    Event Date/Time   First MD Initiated Contact with Patient 12/15/22 1612     (approximate)   History   Chest Pain   HPI  Carmen Barr is a 57 year old female presenting to the emergency department for evaluation of chest pain.  This morning, patient had onset of chest pain described as a tightness over the left side of her chest radiating to her arm and leg.  Does report some associated shortness of breath.  Had 1 episode of vomiting.  Denies abdominal pain.  Reports she has been under significant stress recently with unstable housing situation.      Physical Exam   Triage Vital Signs: ED Triage Vitals  Encounter Vitals Group     BP 12/15/22 1250 (!) 145/94     Systolic BP Percentile --      Diastolic BP Percentile --      Pulse Rate 12/15/22 1250 61     Resp 12/15/22 1250 20     Temp 12/15/22 1248 97.7 F (36.5 C)     Temp Source 12/15/22 1618 Oral     SpO2 12/15/22 1250 100 %     Weight 12/15/22 1248 270 lb (122.5 kg)     Height 12/15/22 1248 6' (1.829 m)     Head Circumference --      Peak Flow --      Pain Score 12/15/22 1247 8     Pain Loc --      Pain Education --      Exclude from Growth Chart --     Most recent vital signs: Vitals:   12/15/22 1958 12/15/22 2100  BP:  107/75  Pulse:  78  Resp:  15  Temp: 98.4 F (36.9 C)   SpO2:  94%     General: Awake, interactive  CV:  Regular rate, good peripheral perfusion.  Resp:  Unlabored respirations, lungs clear to auscultation Chest wall: Tenderness to palpation throughout the anterior chest wall Abd:  Nondistended, nontender to palpation Neuro:  Symmetric facial movement, fluid speech   ED Results / Procedures / Treatments   Labs (all labs ordered are listed, but only abnormal results are displayed) Labs Reviewed  BASIC METABOLIC PANEL - Abnormal; Notable for the following components:      Result Value   Potassium 3.2 (*)    Chloride  97 (*)    Glucose, Bld 120 (*)    All other components within normal limits  D-DIMER, QUANTITATIVE - Abnormal; Notable for the following components:   D-Dimer, Quant 2.09 (*)    All other components within normal limits  URINALYSIS, W/ REFLEX TO CULTURE (INFECTION SUSPECTED) - Abnormal; Notable for the following components:   Color, Urine STRAW (*)    APPearance CLEAR (*)    Hgb urine dipstick MODERATE (*)    All other components within normal limits  CBC  HEPATIC FUNCTION PANEL  LIPASE, BLOOD  PROTIME-INR  APTT  MAGNESIUM  HEPARIN LEVEL (UNFRACTIONATED)  CBC  HIV ANTIBODY (ROUTINE TESTING W REFLEX)  BASIC METABOLIC PANEL  TROPONIN I (HIGH SENSITIVITY)  TROPONIN I (HIGH SENSITIVITY)     EKG EKG independently reviewed interpreted by myself (ER attending) demonstrates:  EKG demonstrates normal sinus rhythm at a rate of 65, PR 142, QRS 100, QTc 478, no acute ST changes  RADIOLOGY Imaging independently reviewed and interpreted by myself demonstrates:  CXR without focal consolidation CTA of the chest did demonstrate bilateral pulmonary emboli  without right heart strain, radiology notes possible consolidation at the right base, suspect atelectasis in the absence of infectious symptoms  PROCEDURES:  Critical Care performed: Yes, see critical care procedure note(s)  CRITICAL CARE Performed by: Trinna Post   Total critical care time: 32 minutes  Critical care time was exclusive of separately billable procedures and treating other patients.  Critical care was necessary to treat or prevent imminent or life-threatening deterioration.  Critical care was time spent personally by me on the following activities: development of treatment plan with patient and/or surrogate as well as nursing, discussions with consultants, evaluation of patient's response to treatment, examination of patient, obtaining history from patient or surrogate, ordering and performing treatments and interventions,  ordering and review of laboratory studies, ordering and review of radiographic studies, pulse oximetry and re-evaluation of patient's condition.   Procedures   MEDICATIONS ORDERED IN ED: Medications  heparin ADULT infusion 100 units/mL (25000 units/233mL) (1,600 Units/hr Intravenous New Bag/Given 12/15/22 1913)  albuterol (PROVENTIL) (2.5 MG/3ML) 0.083% nebulizer solution 2.5 mg (has no administration in time range)  dextromethorphan-guaiFENesin (MUCINEX DM) 30-600 MG per 12 hr tablet 1 tablet (has no administration in time range)  ondansetron (ZOFRAN) injection 4 mg (has no administration in time range)  acetaminophen (TYLENOL) tablet 650 mg (has no administration in time range)  HYDROcodone-acetaminophen (NORCO/VICODIN) 5-325 MG per tablet 1 tablet (has no administration in time range)  morphine (PF) 2 MG/ML injection 2 mg (has no administration in time range)  iohexol (OMNIPAQUE) 350 MG/ML injection 80 mL (75 mLs Intravenous Contrast Given 12/15/22 1830)  heparin bolus via infusion 6,000 Units (6,000 Units Intravenous Bolus from Bag 12/15/22 1913)  potassium chloride SA (KLOR-CON M) CR tablet 40 mEq (40 mEq Oral Given by Other 12/15/22 2051)     IMPRESSION / MDM / ASSESSMENT AND PLAN / ED COURSE  I reviewed the triage vital signs and the nursing notes.  Differential diagnosis includes, but is not limited to, ACS, pneumonia, pneumothorax, PE  Patient's presentation is most consistent with acute presentation with potential threat to life or bodily function.  57 year old female presenting to the Emergency Department for evaluation of chest pain.  Vital stable on presentation.  Last notable for hypokalemia, negative initial troponin.  Second troponin obtained which returned within normal limits, D-dimer was obtained which returned elevated at 2.  CT of the chest demonstrated bilateral pulmonary emboli without right heart strain.  Patient was placed on heparin.  She is agreeable with plan  for admission.  Will reach out to hospitalist team.  Reviewed with Dr. Clyde Lundborg.  He will evaluate patient for anticipated admission.      FINAL CLINICAL IMPRESSION(S) / ED DIAGNOSES   Final diagnoses:  Bilateral pulmonary embolism (HCC)     Rx / DC Orders   ED Discharge Orders     None        Note:  This document was prepared using Dragon voice recognition software and may include unintentional dictation errors.   Trinna Post, MD 12/15/22 1610    Trinna Post, MD 12/26/22 [phone]

## 2022-12-15 NOTE — H&P (Signed)
History and Physical    Carmen Barr NGE:[id number] DOB: 06-23-1965 DOA: 12/15/2022  Referring MD/NP/PA:   PCP: Caffie Damme, MD   Patient coming from:  The patient is homeless, living in Car   Chief Complaint: chest pain  HPI: Carmen Barr is a 57 y.o. female with medical history significant of homeless, hypertension, depression, obesity, who presents with chest pain.  Patient states that her chest pain started this morning, which is located in the left central chest, mild to moderate, sharp, nonradiating.  Associated with mild shortness of breath, no cough, fever or chills.  Patient has nausea vomited once, which has resolved.  Currently denies nausea vomiting, diarrhea or abdominal pain.  No symptoms of UTI.  Patient complains of left arm pain and left lower leg pain.   Data reviewed independently and ED Course: pt was found to have positive D-dimer 2.09, WBC 6.3, potassium 3.2, magnesium 2.1, GFR> 60, negative urinalysis.  Temperature normal, blood pressure 140/91, heart rate 50-60s, RR 20, oxygen saturation 100% on room air.  Chest x-ray negative.  CTA showed bilateral PE without CT evidence of right heart strain.  Lower extremity venous Doppler negative for DVT.  Patient is placed on telemetry bed for observation.  EKG: I have personally reviewed.  Sinus rhythm, QTc 478, low voltage, early R wave progression.   Review of Systems:   General: no fevers, chills, no body weight gain, fatigue HEENT: no blurry vision, hearing changes or sore throat Respiratory: has dyspnea, no coughing, wheezing CV: has chest pain, no palpitations GI: no nausea, vomiting, abdominal pain, diarrhea, constipation GU: no dysuria, burning on urination, increased urinary frequency, hematuria  Ext: no leg edema Neuro: no unilateral weakness, numbness, or tingling, no vision change or hearing loss Skin: no rash, no skin tear. MSK: No muscle spasm, no deformity, no limitation of range of  movement in spin Heme: No easy bruising.  Travel history: No recent long distant travel.   Allergy:  Allergies  Allergen Reactions   Percocet [Oxycodone-Acetaminophen] Itching    Itching     Past Medical History:  Diagnosis Date   Depression with anxiety    HTN (hypertension)    Obesity (BMI 30-39.9)     Past Surgical History:  Procedure Laterality Date   ABDOMINAL HYSTERECTOMY     APPENDECTOMY      Social History:  reports that she has never smoked. She has never used smokeless tobacco. She reports that she does not drink alcohol and does not use drugs.  Family History:  Family History  Problem Relation Age of Onset   Breast cancer Maternal Grandmother 41   Breast cancer Cousin        mat cousin     Prior to Admission medications   Medication Sig Start Date End Date Taking? Authorizing Provider  ciclopirox [facility]) 8 % solution  03/02/18   [provider]  citalopram (CELEXA) 20 MG tablet citalopram 20 mg tablet 03/03/18   [provider]  dexamethasone (DECADRON) 4 MG/ML injection  01/28/18   [provider]  diclofenac sodium (VOLTAREN) 1 % GEL  03/11/18   [provider]  Flaxseed, Linseed, (FLAXSEED OIL PO) Use    [provider]  fluticasone (FLONASE) 50 MCG/ACT nasal spray fluticasone propionate 50 mcg/actuation nasal spray,suspension    [provider]    Physical Exam: Vitals:   12/15/22 1250 12/15/22 1618 12/15/22 1915 12/15/22 1958  BP: (!) 145/94 (!) 140/91 123/77   Pulse: 61 (!) 58  72   Resp: 20 20 16    Temp:  98 F (36.7 C)  98.4 F (36.9 C)  TempSrc:  Oral  Oral  SpO2: 100% 100% 99%   Weight:      Height:       General: Not in acute distress HEENT:       Eyes: PERRL, EOMI, no jaundice       ENT: No discharge from the ears and nose, no pharynx injection, no tonsillar enlargement.        Neck: No JVD, no bruit, no mass felt. Heme: No neck lymph node enlargement. Cardiac: S1/S2, RRR, No  murmurs, No gallops or rubs. Respiratory: No rales, wheezing, rhonchi or rubs. GI: Soft, nondistended, nontender, no rebound pain, no organomegaly, BS present. GU: No hematuria Ext: No pitting leg edema bilaterally. 1+DP/PT pulse bilaterally. Musculoskeletal: No joint deformities, No joint redness or warmth, no limitation of ROM in spin. Skin: No rashes.  Neuro: Alert, oriented X3, cranial nerves II-XII grossly intact, moves all extremities normally. Psych: Patient is not psychotic, no suicidal or hemocidal ideation.  Labs on Admission: I have personally reviewed following labs and imaging studies  CBC: Recent Labs  Lab 12/15/22 1248  WBC 6.4  HGB 12.5  HCT 37.2  MCV 91.2  PLT 271   Basic Metabolic Panel: Recent Labs  Lab 12/15/22 1248 12/15/22 1658  NA 137  --   K 3.2*  --   CL 97*  --   CO2 27  --   GLUCOSE 120*  --   BUN 18  --   CREATININE 0.86  --   CALCIUM 9.2  --   MG  --  2.1   GFR: Estimated Creatinine Clearance: 105.8 mL/min (by C-G formula based on SCr of 0.86 mg/dL). Liver Function Tests: Recent Labs  Lab 12/15/22 1248  AST 27  ALT 30  ALKPHOS 93  BILITOT 0.6  PROT 7.0  ALBUMIN 3.9   Recent Labs  Lab 12/15/22 1248  LIPASE 37   No results for input(s): "AMMONIA" in the last 168 hours. Coagulation Profile: Recent Labs  Lab 12/15/22 1658  INR 1.1   Cardiac Enzymes: No results for input(s): "CKTOTAL", "CKMB", "CKMBINDEX", "TROPONINI" in the last 168 hours. BNP (last 3 results) No results for input(s): "PROBNP" in the last 8760 hours. HbA1C: No results for input(s): "HGBA1C" in the last 72 hours. CBG: No results for input(s): "GLUCAP" in the last 168 hours. Lipid Profile: No results for input(s): "CHOL", "HDL", "LDLCALC", "TRIG", "CHOLHDL", "LDLDIRECT" in the last 72 hours. Thyroid Function Tests: No results for input(s): "TSH", "T4TOTAL", "FREET4", "T3FREE", "THYROIDAB" in the last 72 hours. Anemia Panel: No results for input(s):  "VITAMINB12", "FOLATE", "FERRITIN", "TIBC", "IRON", "RETICCTPCT" in the last 72 hours. Urine analysis:    Component Value Date/Time   COLORURINE STRAW (A) 12/15/2022 1658   APPEARANCEUR CLEAR (A) 12/15/2022 1658   APPEARANCEUR Clear 11/15/2022 0000   LABSPEC 1.006 12/15/2022 1658   PHURINE 6.0 12/15/2022 1658   GLUCOSEU NEGATIVE 12/15/2022 1658   HGBUR MODERATE (A) 12/15/2022 1658   BILIRUBINUR NEGATIVE 12/15/2022 1658   BILIRUBINUR Negative 11/15/2022 0000   KETONESUR NEGATIVE 12/15/2022 1658   PROTEINUR NEGATIVE 12/15/2022 1658   NITRITE NEGATIVE 12/15/2022 1658   LEUKOCYTESUR NEGATIVE 12/15/2022 1658   Sepsis Labs: @LABRCNTIP (procalcitonin:4,lacticidven:4) )No results found for this or any previous visit (from the past 240 hours).   Radiological Exams on Admission: CT Angio Chest PE W and/or Wo Contrast Result Date: 12/15/2022 CLINICAL DATA:  Radiating chest pain. EXAM: CT ANGIOGRAPHY CHEST WITH CONTRAST TECHNIQUE: Multidetector CT imaging of the chest was performed using the standard protocol during bolus administration of intravenous contrast. Multiplanar CT image reconstructions and MIPs were obtained to evaluate the vascular anatomy. RADIATION DOSE REDUCTION: This exam was performed according to the departmental dose-optimization program which includes automated exposure control, adjustment of the mA and/or kV according to patient size and/or use of iterative reconstruction technique. CONTRAST:  75mL OMNIPAQUE IOHEXOL 350 MG/ML SOLN COMPARISON:  None Available. FINDINGS: Cardiovascular: Filling defects identified in proximal and distal branches of the right pulmonary artery as well as left lower lobe pulmonary arteries. No pericardial effusion or cardiomegaly. No aneurysm. No evidence of right heart strain. Mediastinum/Nodes: No enlarged mediastinal, hilar, or axillary lymph nodes. Thyroid gland, trachea, and esophagus demonstrate no significant findings. Lungs/Pleura: Nodular density  consistent with atelectasis or minimal consolidation in the right base laterally. Lungs are otherwise clear. No pneumothorax or pleural effusion. Upper Abdomen: No acute abnormality. Musculoskeletal: No chest wall abnormality. No acute or significant osseous findings. Review of the MIP images confirms the above findings. IMPRESSION: 1. Bilateral pulmonary emboli. 2. Atelectasis or minimal consolidation right base. Findings were conveyed to Dr. Rosalia Hammers via Epic secure chat. Electronically Signed   By: Layla Maw M.D.   On: 12/15/2022 18:50   DG Chest 2 View Result Date: 12/15/2022 CLINICAL DATA:  Chest pain.  Vomiting. EXAM: CHEST - 2 VIEW COMPARISON:  None Available. FINDINGS: The heart size and mediastinal contours are within normal limits. Both lungs are clear. Mild thoracic dextroscoliosis noted. IMPRESSION: No active cardiopulmonary disease. Electronically Signed   By: Danae Orleans M.D.   On: 12/15/2022 13:38      Assessment/Plan Principal Problem:   Acute pulmonary embolism (HCC) Active Problems:   Hypokalemia   HTN (hypertension)   Depression with anxiety   Homeless   Obesity (BMI 30-39.9)   Assessment and Plan:   Principal Problem:   Acute pulmonary embolism (HCC) Active Problems:   Hypokalemia   HTN (hypertension)   Depression with anxiety   Homeless   Obesity (BMI 30-39.9)      DVT ppx: on IV Heparin  Code Status: Full code    Family Communication: not done, no family member is at bed side.    Disposition Plan: To be determined  Consults called:    Admission status and Level of care: Telemetry Medical:   for obs   Dispo: The patient is from:  Homeless              Anticipated d/c is to: To be determined              Anticipated d/c date is: 1 day              Patient currently is not medically stable to d/c.    Severity of Illness:  The appropriate patient status for this patient is OBSERVATION. Observation status is judged to be reasonable and  necessary in order to provide the required intensity of service to ensure the patient's safety. The patient's presenting symptoms, physical exam findings, and initial radiographic and laboratory data in the context of their medical condition is felt to place them at decreased risk for further clinical deterioration. Furthermore, it is anticipated that the patient will be medically stable for discharge from the hospital within 2 midnights of admission.        Date of Service 12/15/2022    Lorretta Harp [facility]   If  7PM-7AM, please contact night-coverage [email or web address] 12/15/2022, 10:17 PM

## 2022-12-15 NOTE — ED Triage Notes (Signed)
Pt to ED for chest pain radiating to whole left side of body started this am. +emesis.

## 2022-12-15 NOTE — Consult Note (Signed)
PHARMACY - ANTICOAGULATION CONSULT NOTE  Pharmacy Consult for heparin infusion Indication: pulmonary embolus  Allergies  Allergen Reactions   Percocet [Oxycodone-Acetaminophen] Itching    Itching     Patient Measurements: Height: 6' (182.9 cm) Weight: 122.5 kg (270 lb) IBW/kg (Calculated) : 73.1 Heparin Dosing Weight: 100.7 kg  Vital Signs: Temp: 98 F (36.7 C) (12/14 1618) Temp Source: Oral (12/14 1618) BP: 140/91 (12/14 1618) Pulse Rate: 58 (12/14 1618)  Labs: Recent Labs    12/15/22 1248 12/15/22 1658  HGB 12.5  --   HCT 37.2  --   PLT 271  --   CREATININE 0.86  --   TROPONINIHS 2 4    Estimated Creatinine Clearance: 105.8 mL/min (by C-G formula based on SCr of 0.86 mg/dL).   Medical History: History reviewed. No pertinent past medical history.  Medications:  No home anticoagulants per pharmacist review  Assessment: 57 yo female presented to the ED with chest pain and emesis.  Patient found to have elevated d-dimer. Chest CT positive for bilateral PE. Pharmacy consulted for initiation of heparin infusion.  Goal of Therapy:  Heparin level 0.3-0.7 units/ml Monitor platelets by anticoagulation protocol: Yes   Plan:  Give 6000 units bolus x 1 Start heparin infusion at 1600 units/hr Check anti-Xa level in 6 hours and daily while on heparin Continue to monitor H&H and platelets  Barrie Folk, PharmD 12/15/2022,6:51 PM

## 2022-12-16 ENCOUNTER — Observation Stay: Payer: Medicaid Other

## 2022-12-16 DIAGNOSIS — F418 Other specified anxiety disorders: Secondary | ICD-10-CM | POA: Diagnosis not present

## 2022-12-16 DIAGNOSIS — I10 Essential (primary) hypertension: Secondary | ICD-10-CM | POA: Diagnosis not present

## 2022-12-16 DIAGNOSIS — Z5900 Homelessness unspecified: Secondary | ICD-10-CM

## 2022-12-16 DIAGNOSIS — E876 Hypokalemia: Secondary | ICD-10-CM | POA: Diagnosis not present

## 2022-12-16 DIAGNOSIS — I2699 Other pulmonary embolism without acute cor pulmonale: Secondary | ICD-10-CM | POA: Diagnosis not present

## 2022-12-16 LAB — CBC
HCT: 35 % — ABNORMAL LOW (ref 36.0–46.0)
Hemoglobin: 12.1 g/dL (ref 12.0–15.0)
MCH: 31 pg (ref 26.0–34.0)
MCHC: 34.6 g/dL (ref 30.0–36.0)
MCV: 89.7 fL (ref 80.0–100.0)
Platelets: 267 10*3/uL (ref 150–400)
RBC: 3.9 MIL/uL (ref 3.87–5.11)
RDW: 13.2 % (ref 11.5–15.5)
WBC: 6 10*3/uL (ref 4.0–10.5)
nRBC: 0 % (ref 0.0–0.2)

## 2022-12-16 LAB — BASIC METABOLIC PANEL
Anion gap: 7 (ref 5–15)
BUN: 12 mg/dL (ref 6–20)
CO2: 30 mmol/L (ref 22–32)
Calcium: 9 mg/dL (ref 8.9–10.3)
Chloride: 104 mmol/L (ref 98–111)
Creatinine, Ser: 0.84 mg/dL (ref 0.44–1.00)
GFR, Estimated: >60 mL/min (ref >60)
Glucose, Bld: 109 mg/dL — ABNORMAL HIGH (ref 70–99)
Potassium: 3.2 mmol/L — ABNORMAL LOW (ref 3.5–5.1)
Sodium: 141 mmol/L (ref 135–145)

## 2022-12-16 LAB — HIV ANTIBODY (ROUTINE TESTING W REFLEX): HIV Screen 4th Generation wRfx: NONREACTIVE

## 2022-12-16 LAB — HEPARIN LEVEL (UNFRACTIONATED)
Heparin Unfractionated: 0.52 [IU]/mL (ref 0.30–0.70)
Heparin Unfractionated: 0.43 [IU]/mL (ref 0.30–0.70)

## 2022-12-16 MED ORDER — APIXABAN 5 MG PO TABS: 5.0000 mg | ORAL_TABLET | Freq: Two times a day (BID) | ORAL | Status: DC

## 2022-12-16 MED ORDER — HYDROCHLOROTHIAZIDE 25 MG PO TABS
25.0000 mg | ORAL_TABLET | Freq: Every day | ORAL | Status: DC
  Administered 2022-12-17: 25 mg via ORAL
  Filled 2022-12-16 (×2): qty 1

## 2022-12-16 MED ORDER — POTASSIUM CHLORIDE CRYS ER 20 MEQ PO TBCR
40.0000 meq | EXTENDED_RELEASE_TABLET | Freq: Once | ORAL | Status: AC
  Administered 2022-12-16: 40 meq via ORAL
  Filled 2022-12-16: qty 2

## 2022-12-16 MED ORDER — PANTOPRAZOLE SODIUM 40 MG PO TBEC
40.0000 mg | DELAYED_RELEASE_TABLET | Freq: Every day | ORAL | Status: DC
  Administered 2022-12-17: 40 mg via ORAL
  Filled 2022-12-16 (×2): qty 1

## 2022-12-16 MED ORDER — CITALOPRAM HYDROBROMIDE 20 MG PO TABS: 20.0000 mg | ORAL_TABLET | Freq: Every day | ORAL | Status: DC

## 2022-12-16 MED ORDER — APIXABAN 5 MG PO TABS
10.0000 mg | ORAL_TABLET | Freq: Two times a day (BID) | ORAL | Status: DC
  Administered 2022-12-16 – 2022-12-17 (×2): 10 mg via ORAL
  Filled 2022-12-16 (×2): qty 2

## 2022-12-16 MED ORDER — ESCITALOPRAM OXALATE 10 MG PO TABS
20.0000 mg | ORAL_TABLET | Freq: Every day | ORAL | Status: DC
  Administered 2022-12-16 – 2022-12-17 (×2): 20 mg via ORAL
  Filled 2022-12-16 (×2): qty 2

## 2022-12-16 NOTE — ED Notes (Signed)
Pt called out needing help. When this RN entered the room, pt had accidentally dropped their food tray on the ground and their IV pump was beeping. The secretary had reordered food for the pt, when the pt called out to let them know what they needed. This RN fixed the issue with the IV pump and cleaned the food up from the floor. Pt received their new breakfast tray and their bed is in the lowest, locked position, with call bell in reach.

## 2022-12-16 NOTE — ED Notes (Signed)
Pt ambulated to in room toilet without assistance from staff. Pt tolerated activity well.

## 2022-12-16 NOTE — Assessment & Plan Note (Signed)
-

## 2022-12-16 NOTE — Progress Notes (Addendum)
PROGRESS NOTE   Carmen Barr  XBJ:[id number] DOB: [birth year] DOA: 12/15/2022 PCP: Caffie Damme, MD   Date of Service: the patient was seen and examined on 12/16/2022  Brief Narrative:  57 y.o. homeless female with past medical history of hypertension, major depression, gastroesophageal reflux disease and obesity presenting to [facility] emergency department with chest pain.  Upon evaluation in the emergency department patient was found to have a D-dimer of 2.09 with CT angiogram revealing bilateral pulmonary emboli without evidence of right heart strain.  Bilateral lower extremity Doppler was negative for DVT.  The hospitalist group was then called to assess the patient for admission to the hospital.       Assessment & Plan Acute pulmonary embolism (HCC) Chest pain slowly improving  transitioning from intravenous heparin to Eliquis today No events on telemetry Await echocardiogram Providing supplemental oxygen as necessary If patient tolerates Eliquis overnight with unremarkable echocardiogram can likely go home on 12/16. Essential hypertension Blood pressures at target Continuing hydrochlorothiazide Hypokalemia Persisting hypokalemia Replacing with potassium chloride Evaluating for concurrent hypomagnesemia  Monitoring potassium levels with serial chemistries.  Depression with anxiety Continue citalopram Homeless Living instability and financial challenges will prove affording Eliquis difficult at time of discharge TOC consultation placed for both assistance with medications as well as provision of resources in the community   Subjective:  Patient still complaining of chest discomfort, mild in intensity, sharp in quality, radiating into the left shoulder, nonradiating.  Patient states that her associated shortness of breath has resolved.  Patient denies any associated cough.  Physical Exam:  Vitals:   12/16/22 0730 12/16/22 0756 12/16/22 0838 12/16/22 0900  BP: 108/70   120/70 117/62  Pulse: (!) 59  68 89  Resp: (!) 21  11 (!) 21  Temp:  98.2 F (36.8 C)    TempSrc:  Oral    SpO2: 100%  98% 97%  Weight:      Height:        Constitutional: Awake alert and oriented x3, no associated distress.   Skin: no rashes, no lesions, good skin turgor noted. Eyes: Pupils are equally reactive to light.  No evidence of scleral icterus or conjunctival pallor.  ENMT: Moist mucous membranes noted.  Posterior pharynx clear of any exudate or lesions.   Respiratory: clear to auscultation bilaterally, no wheezing, no crackles. Normal respiratory effort. No accessory muscle use.  Cardiovascular: Regular rate and rhythm, no murmurs / rubs / gallops. No extremity edema. 2+ pedal pulses. No carotid bruits.  Abdomen: Abdomen is soft and nontender.  No evidence of intra-abdominal masses.  Positive bowel sounds noted in all quadrants.   Musculoskeletal: No joint deformity upper and lower extremities. Good ROM, no contractures. Normal muscle tone.    Data Reviewed:  I have personally reviewed and interpreted labs, imaging.  Significant findings are   CBC: Recent Labs  Lab 12/15/22 1248 12/16/22 0513  WBC 6.4 6.0  HGB 12.5 12.1  HCT 37.2 35.0*  MCV 91.2 89.7  PLT 271 267   Basic Metabolic Panel: Recent Labs  Lab 12/15/22 1248 12/15/22 1658 12/16/22 0513  NA 137  --  141  K 3.2*  --  3.2*  CL 97*  --  104  CO2 27  --  30  GLUCOSE 120*  --  109*  BUN 18  --  12  CREATININE 0.86  --  0.84  CALCIUM 9.2  --  9.0  MG  --  2.1  --    GFR: Estimated  Creatinine Clearance: 108.4 mL/min (by C-G formula based on SCr of 0.84 mg/dL). Liver Function Tests: Recent Labs  Lab 12/15/22 1248  AST 27  ALT 30  ALKPHOS 93  BILITOT 0.6  PROT 7.0  ALBUMIN 3.9    Coagulation Profile: Recent Labs  Lab 12/15/22 1658  INR 1.1    Telemetry: Personally reviewed.  Rhythm is normal sinus rhythm with heart rate of 90 bpm.    Code Status:  Full code.  Code status  decision has been confirmed with: patient    Severity of Illness:  The appropriate patient status for this patient is OBSERVATION. Observation status is judged to be reasonable and necessary in order to provide the required intensity of service to ensure the patient's safety. The patient's presenting symptoms, physical exam findings, and initial radiographic and laboratory data in the context of their medical condition is felt to place them at decreased risk for further clinical deterioration. Furthermore, it is anticipated that the patient will be medically stable for discharge from the hospital within 2 midnights of admission.   Time spent:  45 minutes  Author:  Marinda Elk MD  12/16/2022 10:01 AM

## 2022-12-16 NOTE — Assessment & Plan Note (Addendum)
Persisting hypokalemia Replacing with potassium chloride Evaluating for concurrent hypomagnesemia  Monitoring potassium levels with serial chemistries.

## 2022-12-16 NOTE — Assessment & Plan Note (Signed)
Blood pressures at target Continuing hydrochlorothiazide

## 2022-12-16 NOTE — TOC Initial Note (Signed)
Transition of Care [facility]) - Initial/Assessment Note    Patient Details  Name: Carmen Barr MRN: [id number] Date of Birth: 12-Nov-1965  [facility] CM/SW Contact:    Colette Ribas, LCSWA Phone Number: 12/16/2022, 4:06 PM  Clinical Narrative:                  CSW receieved consult for patient being homeless & medication assistance. CSW added shelters and eloquis info to AVS.       Patient Goals and CMS Choice            Expected Discharge Plan and Services                                              Prior Living Arrangements/Services                       Activities of Daily Living      Permission Sought/Granted                  Emotional Assessment              Admission diagnosis:  Acute pulmonary embolism (HCC) [I26.99] Patient Active Problem List   Diagnosis Date Noted   Acute pulmonary embolism (HCC) 12/15/2022   Hypokalemia 12/15/2022   Homeless 12/15/2022   Depression with anxiety    Obesity (BMI 30-39.9)    HTN (hypertension)    PCP:  Caffie Damme, MD Pharmacy:  No Pharmacies Listed    Social Drivers of Health (SDOH) Social History: SDOH Screenings   Depression [phone]): Low Risk  (10/29/2018)  Physical Activity: Sufficiently Active (12/30/2018)   Received from [facility], [facility]  Stress: Stress Concern Present (12/30/2018)   Received from [facility], [facility]  Tobacco Use: Low Risk  (12/15/2022)   SDOH Interventions:     Readmission Risk Interventions     No data to display

## 2022-12-16 NOTE — Assessment & Plan Note (Signed)
Chest pain slowly improving  transitioning from intravenous heparin to Eliquis today No events on telemetry Await echocardiogram Providing supplemental oxygen as necessary If patient tolerates Eliquis overnight with unremarkable echocardiogram can likely go home on 12/16.

## 2022-12-16 NOTE — Hospital Course (Addendum)
57 y.o. homeless female with past medical history of hypertension, major depression, gastroesophageal reflux disease and obesity presenting to [facility] emergency department with chest pain.  Upon evaluation in the emergency department patient was found to have a D-dimer of 2.09 with CT angiogram revealing bilateral pulmonary emboli without evidence of right heart strain.  Bilateral lower extremity Doppler was negative for DVT.  The hospitalist group was then called to assess the patient for admission to the hospital.

## 2022-12-16 NOTE — Consult Note (Signed)
PHARMACY - ANTICOAGULATION CONSULT NOTE  Pharmacy Consult for heparin infusion Indication: pulmonary embolus  Allergies  Allergen Reactions   Percocet [Oxycodone-Acetaminophen] Itching    Itching     Patient Measurements: Height: 6' (182.9 cm) Weight: 122.5 kg (270 lb) IBW/kg (Calculated) : 73.1 Heparin Dosing Weight: 100.7 kg  Vital Signs: Temp: 98.3 F (36.8 C) (12/14 2357) Temp Source: Oral (12/14 1958) BP: 93/59 (12/15 0000) Pulse Rate: 57 (12/15 0000)  Labs: Recent Labs    12/15/22 1248 12/15/22 1658 12/16/22 0127  HGB 12.5  --   --   HCT 37.2  --   --   PLT 271  --   --   APTT  --  30  --   LABPROT  --  14.5  --   INR  --  1.1  --   HEPARINUNFRC  --   --  0.52  CREATININE 0.86  --   --   TROPONINIHS 2 4  --     Estimated Creatinine Clearance: 105.8 mL/min (by C-G formula based on SCr of 0.86 mg/dL).   Medical History: Past Medical History:  Diagnosis Date   Depression with anxiety    HTN (hypertension)    Obesity (BMI 30-39.9)     Medications:  No home anticoagulants per pharmacist review  Assessment: 57 yo female presented to the ED with chest pain and emesis.  Patient found to have elevated d-dimer. Chest CT positive for bilateral PE. Pharmacy consulted for initiation of heparin infusion.  Goal of Therapy:  Heparin level 0.3-0.7 units/ml Monitor platelets by anticoagulation protocol: Yes  12/15 0127 HL 0.52, therapeutic x 1   Plan:  Continue heparin infusion at 1600 units/hr Will recheck HL in 6 hr to confirm  CBC daily while on heparin  Otelia Sergeant, PharmD, [facility] 12/16/2022 3:22 AM

## 2022-12-16 NOTE — Assessment & Plan Note (Signed)
Living instability and financial challenges will prove affording Eliquis difficult at time of discharge [facility] consultation placed for both assistance with medications as well as provision of resources in the community
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
## 2022-12-17 ENCOUNTER — Observation Stay (HOSPITAL_BASED_OUTPATIENT_CLINIC_OR_DEPARTMENT_OTHER)
Admit: 2022-12-17 | Discharge: 2022-12-17 | Disposition: A | Discharge status: Home / Self Care | Payer: Medicaid Other | Attending: Internal Medicine | Admitting: Internal Medicine

## 2022-12-17 ENCOUNTER — Other Ambulatory Visit: Payer: Self-pay

## 2022-12-17 ENCOUNTER — Telehealth (HOSPITAL_COMMUNITY): Payer: Self-pay

## 2022-12-17 ENCOUNTER — Other Ambulatory Visit (HOSPITAL_COMMUNITY): Payer: Self-pay

## 2022-12-17 DIAGNOSIS — I2609 Other pulmonary embolism with acute cor pulmonale: Secondary | ICD-10-CM

## 2022-12-17 DIAGNOSIS — I2699 Other pulmonary embolism without acute cor pulmonale: Secondary | ICD-10-CM | POA: Diagnosis not present

## 2022-12-17 LAB — CBC
HCT: 33.7 % — ABNORMAL LOW (ref 36.0–46.0)
Hemoglobin: 11.5 g/dL — ABNORMAL LOW (ref 12.0–15.0)
MCH: 30.3 pg (ref 26.0–34.0)
MCHC: 34.1 g/dL (ref 30.0–36.0)
MCV: 88.7 fL (ref 80.0–100.0)
Platelets: 273 10*3/uL (ref 150–400)
RBC: 3.8 MIL/uL — ABNORMAL LOW (ref 3.87–5.11)
RDW: 13.3 % (ref 11.5–15.5)
WBC: 4.9 10*3/uL (ref 4.0–10.5)
nRBC: 0 % (ref 0.0–0.2)

## 2022-12-17 LAB — COMPREHENSIVE METABOLIC PANEL
ALT: 21 U/L (ref 0–44)
AST: 19 U/L (ref 15–41)
Albumin: 3 g/dL — ABNORMAL LOW (ref 3.5–5.0)
Alkaline Phosphatase: 75 U/L (ref 38–126)
Anion gap: 6 (ref 5–15)
BUN: 12 mg/dL (ref 6–20)
CO2: 29 mmol/L (ref 22–32)
Calcium: 8.6 mg/dL — ABNORMAL LOW (ref 8.9–10.3)
Chloride: 106 mmol/L (ref 98–111)
Creatinine, Ser: 0.75 mg/dL (ref 0.44–1.00)
GFR, Estimated: >60 mL/min (ref >60)
Glucose, Bld: 100 mg/dL — ABNORMAL HIGH (ref 70–99)
Potassium: 3.5 mmol/L (ref 3.5–5.1)
Sodium: 141 mmol/L (ref 135–145)
Total Bilirubin: 0.7 mg/dL (ref <1.2)
Total Protein: 5.7 g/dL — ABNORMAL LOW (ref 6.5–8.1)

## 2022-12-17 LAB — ECHOCARDIOGRAM COMPLETE
AR max vel: 2.99 cm2
AV Area VTI: 2.99 cm2
AV Area mean vel: 2.78 cm2
AV Mean grad: 4 mm[Hg]
AV Peak grad: 7.5 mm[Hg]
Ao pk vel: 1.37 m/s
Area-P 1/2: 4.36 cm2
Height: 72 in
MV VTI: 2.92 cm2
S' Lateral: 3.3 cm
Weight: 4320 [oz_av]

## 2022-12-17 LAB — MAGNESIUM: Magnesium: 2 mg/dL (ref 1.7–2.4)

## 2022-12-17 MED ORDER — APIXABAN (ELIQUIS) VTE STARTER PACK (10MG AND 5MG)
ORAL_TABLET | ORAL | 0 refills | Status: DC
  Filled 2022-12-17: qty 74, 30d supply, fill #0

## 2022-12-17 MED ORDER — HYDROCHLOROTHIAZIDE 25 MG PO TABS
25.0000 mg | ORAL_TABLET | Freq: Every day | ORAL | 0 refills | Status: AC
  Filled 2022-12-17: qty 90, 90d supply, fill #0

## 2022-12-17 MED ORDER — ESOMEPRAZOLE MAGNESIUM 20 MG PO CPDR
20.0000 mg | DELAYED_RELEASE_CAPSULE | Freq: Every day | ORAL | 0 refills | Status: AC
  Filled 2022-12-17: qty 90, 90d supply, fill #0

## 2022-12-17 MED ORDER — CITALOPRAM HYDROBROMIDE 20 MG PO TABS
20.0000 mg | ORAL_TABLET | Freq: Every day | ORAL | 0 refills | Status: AC
  Filled 2022-12-17: qty 90, 90d supply, fill #0

## 2022-12-17 NOTE — Telephone Encounter (Signed)
 Pharmacy Patient Advocate Encounter  Insurance verification completed.    The patient is insured through [facility] MEDICAID.     Ran test claim for Eliquis and the current 30 day co-pay is $4.00.   This test claim was processed through [facility]- copay amounts may vary at other pharmacies due to pharmacy/plan contracts, or as the patient moves through the different stages of their insurance plan.

## 2022-12-17 NOTE — Discharge Summary (Signed)
Physician Discharge Summary  Carmen Barr ZOX:[id number] DOB: 1965-12-01 DOA: 12/15/2022  PCP: Caffie Damme, MD  Admit date: 12/15/2022 Discharge date: 12/17/2022  Admitted From: Homeless Disposition: Homeless, shelter information given by [facility]  Recommendations for Outpatient Follow-up:  Follow up with PCP in 1-2 weeks Will need refills of Eliquis  Home Health: No Equipment/Devices: None  Discharge Condition: Stable CODE STATUS: Full code Diet recommendation: Heart healthy diet  History of present illness:  Carmen Barr is a homeless 57 year old female with past medical history significant for HTN, anxiety/depression, GERD who presented to [facility] ED on 12/14 with complaints of chest pain.  Upon workup in the ED, patient was noted to have an elevated D-dimer of 2.09.  CT angiogram chest revealed bilateral pulmonary emboli without evidence of right heart strain.  Bilateral lower extremity Doppler ultrasound negative for DVT.  Patient was started on a heparin drip.  EDP consulted [facility] for admission for further evaluation and management of acute PE.  Hospital course:  Acute pulmonary embolism Patient presenting to ED with chest pain, noted to have an elevated D-dimer of 2.09.  CT angiogram chest notable for bilateral pulmonary emboli without evidence of right heart strain.  Bilateral lower extremity Doppler ultrasound negative for DVT.  TTE with LVEF 60 to 65%, LV with no regional wall motion abnormalities, RV systolic function normal, IVC normal in size.  Patient was initially started on heparin drip and transition to Eliquis.  Patient denies any previous history of thromboembolism and will require treatment for 3 months.  Outpatient follow-up PCP for further refills.  Hypokalemia Repleted during hospitalization.  Essential hypertension Continue hydrochlorothiazide  Anxiety/depression Continue citalopram  Obesity, class II Body mass index is 36.62 kg/m.  Complicates  all facets of care  Homelessness Patient reports living in her car for roughly 1 year.  Currently in between jobs.  Seen by [facility] and given housing/shelter/utility information at time of discharge.  Also home medications refilled through hospital pharmacy.  Discharge Diagnoses:  Principal Problem:   Acute pulmonary embolism (HCC) Active Problems:   Hypokalemia   Essential hypertension   Depression with anxiety   Homeless   Obesity (BMI 30-39.9)    Discharge Instructions  Discharge Instructions     Call MD for:  difficulty breathing, headache or visual disturbances   Complete by: As directed    Call MD for:  extreme fatigue   Complete by: As directed    Call MD for:  persistant dizziness or light-headedness   Complete by: As directed    Call MD for:  persistant nausea and vomiting   Complete by: As directed    Call MD for:  severe uncontrolled pain   Complete by: As directed    Call MD for:  temperature >100.4   Complete by: As directed    Diet - low sodium heart healthy   Complete by: As directed    Increase activity slowly   Complete by: As directed       Allergies as of 12/17/2022       Reactions   Percocet [oxycodone-acetaminophen] Itching   Itching         Medication List     STOP taking these medications    ciclopirox 8 % solution Commonly known as: PENLAC   dexamethasone 4 MG/ML injection Commonly known as: DECADRON   diclofenac sodium 1 % Gel Commonly known as: VOLTAREN   FLAXSEED OIL PO   fluticasone 50 MCG/ACT nasal spray Commonly known as: FLONASE  TAKE these medications    citalopram 20 MG tablet Commonly known as: CELEXA Take 1 tablet (20 mg total) by mouth daily. What changed: See the new instructions.   Eliquis DVT/PE Starter Pack Generic drug: Apixaban Starter Pack (10mg  and 5mg ) Take as directed on package: start with two-5mg  tablets twice daily for 7 days. On day 8, switch to one-5mg  tablet twice daily.    esomeprazole 20 MG capsule Commonly known as: NEXIUM Take 1 capsule (20 mg total) by mouth daily at 12 noon.   hydrochlorothiazide 25 MG tablet Commonly known as: HYDRODIURIL Take 1 tablet (25 mg total) by mouth daily.        Allergies  Allergen Reactions   Percocet [Oxycodone-Acetaminophen] Itching    Itching     Consultations: None   Procedures/Studies: ECHOCARDIOGRAM COMPLETE Result Date: 12/17/2022    ECHOCARDIOGRAM REPORT   Patient Name:   Carmen Barr Date of Exam: 12/17/2022 Medical Rec #:  [id number]           Height:       72.0 in Accession #:    [id number]          Weight:       270.0 lb Date of Birth:  03/05/1965           BSA:          2.420 m Patient Age:    57 years            BP:           109/61 mmHg Patient Gender: F                   HR:           57 bpm. Exam Location:  [facility] Procedure: 2D Echo, Color Doppler and Cardiac Doppler Indications:     Pulmonary Embolus I26.09  History:         Patient has no prior history of Echocardiogram examinations.                  Risk Factors:Hypertension. Anxiety and depression.  Sonographer:     Cristela Blue Referring Phys:  [id number] Brien Few NIU Diagnosing Phys: Lorine Bears MD IMPRESSIONS  1. Left ventricular ejection fraction, by estimation, is 60 to 65%. The left ventricle has normal function. The left ventricle has no regional wall motion abnormalities. Left ventricular diastolic parameters were normal.  2. Right ventricular systolic function is normal. The right ventricular size is normal. Tricuspid regurgitation signal is inadequate for assessing PA pressure.  3. The mitral valve is normal in structure. No evidence of mitral valve regurgitation. No evidence of mitral stenosis.  4. The aortic valve is normal in structure. Aortic valve regurgitation is not visualized. No aortic stenosis is present.  5. The inferior vena cava is normal in size with greater than 50% respiratory variability, suggesting right atrial pressure of 3 mmHg.  FINDINGS  Left Ventricle: Left ventricular ejection fraction, by estimation, is 60 to 65%. The left ventricle has normal function. The left ventricle has no regional wall motion abnormalities. The left ventricular internal cavity size was normal in size. There is  no left ventricular hypertrophy. Left ventricular diastolic parameters were normal. Right Ventricle: The right ventricular size is normal. No increase in right ventricular wall thickness. Right ventricular systolic function is normal. Tricuspid regurgitation signal is inadequate for assessing PA pressure. Left Atrium: Left atrial size was normal in size. Right Atrium: Right atrial size was normal in size.  Pericardium: There is no evidence of pericardial effusion. Mitral Valve: The mitral valve is normal in structure. No evidence of mitral valve regurgitation. No evidence of mitral valve stenosis. MV peak gradient, 3.0 mmHg. The mean mitral valve gradient is 1.0 mmHg. Tricuspid Valve: The tricuspid valve is normal in structure. Tricuspid valve regurgitation is trivial. No evidence of tricuspid stenosis. Aortic Valve: The aortic valve is normal in structure. Aortic valve regurgitation is not visualized. No aortic stenosis is present. Aortic valve mean gradient measures 4.0 mmHg. Aortic valve peak gradient measures 7.5 mmHg. Aortic valve area, by VTI measures 2.99 cm. Pulmonic Valve: The pulmonic valve was normal in structure. Pulmonic valve regurgitation is trivial. No evidence of pulmonic stenosis. Aorta: The aortic root is normal in size and structure. Venous: The inferior vena cava is normal in size with greater than 50% respiratory variability, suggesting right atrial pressure of 3 mmHg. IAS/Shunts: No atrial level shunt detected by color flow Doppler.  LEFT VENTRICLE PLAX 2D LVIDd:         5.10 cm   Diastology LVIDs:         3.30 cm   LV e' medial:    10.60 cm/s LV PW:         1.10 cm   LV E/e' medial:  7.3 LV IVS:        1.10 cm   LV e' lateral:    15.10 cm/s LVOT diam:     2.10 cm   LV E/e' lateral: 5.1 LV SV:         85 LV SV Index:   35 LVOT Area:     3.46 cm  RIGHT VENTRICLE RV Basal diam:  4.00 cm RV Mid diam:    3.40 cm RV S prime:     13.70 cm/s TAPSE (M-mode): 2.1 cm LEFT ATRIUM             Index        RIGHT ATRIUM           Index LA diam:        3.10 cm 1.28 cm/m   RA Area:     12.40 cm LA Vol (A2C):   60.5 ml 25.00 ml/m  RA Volume:   27.90 ml  11.53 ml/m LA Vol (A4C):   37.5 ml 15.49 ml/m LA Biplane Vol: 51.2 ml 21.15 ml/m  AORTIC VALVE AV Area (Vmax):    2.99 cm AV Area (Vmean):   2.78 cm AV Area (VTI):     2.99 cm AV Vmax:           136.50 cm/s AV Vmean:          93.100 cm/s AV VTI:            0.285 m AV Peak Grad:      7.5 mmHg AV Mean Grad:      4.0 mmHg LVOT Vmax:         118.00 cm/s LVOT Vmean:        74.700 cm/s LVOT VTI:          0.246 m LVOT/AV VTI ratio: 0.86  AORTA Ao Root diam: 3.20 cm MITRAL VALVE MV Area (PHT): 4.36 cm    SHUNTS MV Area VTI:   2.92 cm    Systemic VTI:  0.25 m MV Peak grad:  3.0 mmHg    Systemic Diam: 2.10 cm MV Mean grad:  1.0 mmHg MV Vmax:       0.86 m/s MV Vmean:  46.9 cm/s MV Decel Time: 174 msec MV E velocity: 77.60 cm/s MV A velocity: 52.90 cm/s MV E/A ratio:  1.47 Lorine Bears MD Electronically signed by Lorine Bears MD Signature Date/Time: 12/17/2022/11:56:15 AM    Final    US Venous Img Lower Bilateral (DVT) Result Date: 12/16/2022 CLINICAL DATA:  PE EXAM: BILATERAL LOWER EXTREMITY VENOUS DOPPLER ULTRASOUND TECHNIQUE: Gray-scale sonography with graded compression, as well as color Doppler and duplex ultrasound were performed to evaluate the lower extremity deep venous systems from the level of the common femoral vein and including the common femoral, femoral, profunda femoral, popliteal and calf veins including the posterior tibial, peroneal and gastrocnemius veins when visible. The superficial great saphenous vein was also interrogated. Spectral Doppler was utilized to evaluate flow at  rest and with distal augmentation maneuvers in the common femoral, femoral and popliteal veins. COMPARISON:  None Available. FINDINGS: RIGHT LOWER EXTREMITY Common Femoral Vein: No evidence of thrombus. Normal compressibility, respiratory phasicity and response to augmentation. Saphenofemoral Junction: No evidence of thrombus. Normal compressibility and flow on color Doppler imaging. Profunda Femoral Vein: No evidence of thrombus. Normal compressibility and flow on color Doppler imaging. Femoral Vein: No evidence of thrombus. Normal compressibility, respiratory phasicity and response to augmentation. Popliteal Vein: No evidence of thrombus. Normal compressibility, respiratory phasicity and response to augmentation. Calf Veins: No evidence of thrombus. Normal compressibility and flow on color Doppler imaging. Superficial Great Saphenous Vein: No evidence of thrombus. Normal compressibility. Venous Reflux:  None. Other Findings:  None. LEFT LOWER EXTREMITY Common Femoral Vein: No evidence of thrombus. Normal compressibility, respiratory phasicity and response to augmentation. Saphenofemoral Junction: No evidence of thrombus. Normal compressibility and flow on color Doppler imaging. Profunda Femoral Vein: No evidence of thrombus. Normal compressibility and flow on color Doppler imaging. Femoral Vein: No evidence of thrombus. Normal compressibility, respiratory phasicity and response to augmentation. Popliteal Vein: No evidence of thrombus. Normal compressibility, respiratory phasicity and response to augmentation. Calf Veins: No evidence of thrombus. Normal compressibility and flow on color Doppler imaging. Superficial Great Saphenous Vein: No evidence of thrombus. Normal compressibility. Venous Reflux:  None. Other Findings: Left-sided popliteal cyst measures 3.7 x 2.9 x 1.1 cm. Right-sided popliteal cyst measures 3.0 x 2.7 x 0.7 cm. IMPRESSION: No evidence of deep venous thrombosis in either lower extremity.  Bilateral popliteal cysts. Electronically Signed   By: Layla Maw M.D.   On: 12/16/2022 12:06   US Venous Img Upper Uni Left (DVT) Result Date: 12/15/2022 CLINICAL DATA:  Left arm pain EXAM: Left UPPER EXTREMITY VENOUS DOPPLER ULTRASOUND TECHNIQUE: Gray-scale sonography with graded compression, as well as color Doppler and duplex ultrasound were performed to evaluate the upper extremity deep venous system from the level of the subclavian vein and including the jugular, axillary, basilic, radial, ulnar and upper cephalic vein. Spectral Doppler was utilized to evaluate flow at rest and with distal augmentation maneuvers. COMPARISON:  None Available. FINDINGS: Contralateral Subclavian Vein: Respiratory phasicity is normal and symmetric with the symptomatic side. No evidence of thrombus. Normal compressibility. Internal Jugular Vein: No evidence of thrombus. Normal compressibility, respiratory phasicity and response to augmentation. Subclavian Vein: No evidence of thrombus. Normal compressibility, respiratory phasicity and response to augmentation. Axillary Vein: No evidence of thrombus. Normal compressibility, respiratory phasicity and response to augmentation. Cephalic Vein: No evidence of thrombus. Normal compressibility, respiratory phasicity and response to augmentation. Basilic Vein: No evidence of thrombus. Normal compressibility, respiratory phasicity and response to augmentation. Brachial Veins: No evidence of thrombus. Normal compressibility, respiratory phasicity  and response to augmentation. Radial Veins: No evidence of thrombus. Normal compressibility, respiratory phasicity and response to augmentation. Ulnar Veins: No evidence of thrombus. Normal compressibility, respiratory phasicity and response to augmentation. Other Findings:  None visualized. IMPRESSION: No evidence of DVT within the left upper extremity. Electronically Signed   By: Minerva Fester M.D.   On: 12/15/2022 22:46   CT Angio  Chest PE W and/or Wo Contrast Result Date: 12/15/2022 CLINICAL DATA:  Radiating chest pain. EXAM: CT ANGIOGRAPHY CHEST WITH CONTRAST TECHNIQUE: Multidetector CT imaging of the chest was performed using the standard protocol during bolus administration of intravenous contrast. Multiplanar CT image reconstructions and MIPs were obtained to evaluate the vascular anatomy. RADIATION DOSE REDUCTION: This exam was performed according to the departmental dose-optimization program which includes automated exposure control, adjustment of the mA and/or kV according to patient size and/or use of iterative reconstruction technique. CONTRAST:  75mL OMNIPAQUE IOHEXOL 350 MG/ML SOLN COMPARISON:  None Available. FINDINGS: Cardiovascular: Filling defects identified in proximal and distal branches of the right pulmonary artery as well as left lower lobe pulmonary arteries. No pericardial effusion or cardiomegaly. No aneurysm. No evidence of right heart strain. Mediastinum/Nodes: No enlarged mediastinal, hilar, or axillary lymph nodes. Thyroid gland, trachea, and esophagus demonstrate no significant findings. Lungs/Pleura: Nodular density consistent with atelectasis or minimal consolidation in the right base laterally. Lungs are otherwise clear. No pneumothorax or pleural effusion. Upper Abdomen: No acute abnormality. Musculoskeletal: No chest wall abnormality. No acute or significant osseous findings. Review of the MIP images confirms the above findings. IMPRESSION: 1. Bilateral pulmonary emboli. 2. Atelectasis or minimal consolidation right base. Findings were conveyed to Dr. Rosalia Hammers via Epic secure chat. Electronically Signed   By: Layla Maw M.D.   On: 12/15/2022 18:50   DG Chest 2 View Result Date: 12/15/2022 CLINICAL DATA:  Chest pain.  Vomiting. EXAM: CHEST - 2 VIEW COMPARISON:  None Available. FINDINGS: The heart size and mediastinal contours are within normal limits. Both lungs are clear. Mild thoracic dextroscoliosis  noted. IMPRESSION: No active cardiopulmonary disease. Electronically Signed   By: Danae Orleans M.D.   On: 12/15/2022 13:38     Subjective: Seen examined bedside, lying in bed.  No complaints other than concerned about her underlying homelessness.  Met with TOC and given resources for housing/shelters and utilities.  Also her home medications and Eliquis filled through hospital pharmacy.  Discussed with patient needs to follow-up with her outpatient provider for medication refills.  No other specific questions, concerns or complaints at this time.  Denies headache, no visual changes, no current chest pain, no palpitations, no shortness of breath, no abdominal pain, no fever/chills/night sweats, no nausea/vomiting/diarrhea, no focal weakness, no fatigue, no paresthesia.  No acute events overnight per nursing.  Discharge Exam: Vitals:   12/16/22 2006 12/17/22 0352  BP: 107/63 109/61  Pulse: 77 (!) 57  Resp: 16 20  Temp: 98.7 F (37.1 C) 97.9 F (36.6 C)  SpO2: 99% 98%   Vitals:   12/16/22 1600 12/16/22 1624 12/16/22 2006 12/17/22 0352  BP: 125/74 131/88 107/63 109/61  Pulse: 70 63 77 (!) 57  Resp: 20 18 16 20   Temp:   98.7 F (37.1 C) 97.9 F (36.6 C)  TempSrc:   Oral Oral  SpO2: 100% 100% 99% 98%  Weight:      Height:        Physical Exam: GEN: NAD, alert and oriented x 3, obese HEENT: NCAT, PERRL, EOMI, sclera clear, MMM PULM: CTAB  w/o wheezes/crackles, normal respiratory effort, on room air CV: RRR w/o M/G/R GI: abd soft, NTND, NABS, no R/G/M MSK: no peripheral edema, muscle strength globally intact 5/5 bilateral upper/lower extremities NEURO: CN II-XII intact, no focal deficits, sensation to light touch intact PSYCH: normal mood/affect Integumentary: dry/intact, no rashes or wounds    The results of significant diagnostics from this hospitalization (including imaging, microbiology, ancillary and laboratory) are listed below for reference.     Microbiology: No results  found for this or any previous visit (from the past 240 hours).   Labs: BNP (last 3 results) Recent Labs    10/11/22 0640  BNP 20.8   Basic Metabolic Panel: Recent Labs  Lab 12/15/22 1248 12/15/22 1658 12/16/22 0513 12/17/22 0539  NA 137  --  141 141  K 3.2*  --  3.2* 3.5  CL 97*  --  104 106  CO2 27  --  30 29  GLUCOSE 120*  --  109* 100*  BUN 18  --  12 12  CREATININE 0.86  --  0.84 0.75  CALCIUM 9.2  --  9.0 8.6*  MG  --  2.1  --  2.0   Liver Function Tests: Recent Labs  Lab 12/15/22 1248 12/17/22 0539  AST 27 19  ALT 30 21  ALKPHOS 93 75  BILITOT 0.6 0.7  PROT 7.0 5.7*  ALBUMIN 3.9 3.0*   Recent Labs  Lab 12/15/22 1248  LIPASE 37   No results for input(s): "AMMONIA" in the last 168 hours. CBC: Recent Labs  Lab 12/15/22 1248 12/16/22 0513 12/17/22 0539  WBC 6.4 6.0 4.9  HGB 12.5 12.1 11.5*  HCT 37.2 35.0* 33.7*  MCV 91.2 89.7 88.7  PLT 271 267 273   Cardiac Enzymes: No results for input(s): "CKTOTAL", "CKMB", "CKMBINDEX", "TROPONINI" in the last 168 hours. BNP: Invalid input(s): "POCBNP" CBG: No results for input(s): "GLUCAP" in the last 168 hours. D-Dimer Recent Labs    12/15/22 1658  DDIMER 2.09*   Hgb A1c No results for input(s): "HGBA1C" in the last 72 hours. Lipid Profile No results for input(s): "CHOL", "HDL", "LDLCALC", "TRIG", "CHOLHDL", "LDLDIRECT" in the last 72 hours. Thyroid function studies No results for input(s): "TSH", "T4TOTAL", "T3FREE", "THYROIDAB" in the last 72 hours.  Invalid input(s): "FREET3" Anemia work up No results for input(s): "VITAMINB12", "FOLATE", "FERRITIN", "TIBC", "IRON", "RETICCTPCT" in the last 72 hours. Urinalysis    Component Value Date/Time   COLORURINE STRAW (A) 12/15/2022 1658   APPEARANCEUR CLEAR (A) 12/15/2022 1658   APPEARANCEUR Clear 11/15/2022 0000   LABSPEC 1.006 12/15/2022 1658   PHURINE 6.0 12/15/2022 1658   GLUCOSEU NEGATIVE 12/15/2022 1658   HGBUR MODERATE (A) 12/15/2022 1658    BILIRUBINUR NEGATIVE 12/15/2022 1658   BILIRUBINUR Negative 11/15/2022 0000   KETONESUR NEGATIVE 12/15/2022 1658   PROTEINUR NEGATIVE 12/15/2022 1658   NITRITE NEGATIVE 12/15/2022 1658   LEUKOCYTESUR NEGATIVE 12/15/2022 1658   Sepsis Labs Recent Labs  Lab 12/15/22 1248 12/16/22 0513 12/17/22 0539  WBC 6.4 6.0 4.9   Microbiology No results found for this or any previous visit (from the past 240 hours).   Time coordinating discharge: Over 30 minutes  SIGNED:   Alvira Philips [place], DO  [facility] 12/17/2022, 12:38 PM

## 2022-12-17 NOTE — Progress Notes (Signed)
*  PRELIMINARY RESULTS* Echocardiogram 2D Echocardiogram has been performed.  Cristela Blue 12/17/2022, 7:55 AM

## 2022-12-17 NOTE — TOC Transition Note (Signed)
Transition of Care [facility]) - Discharge Note   Patient Details  Name: Carmen Barr MRN: [id number] Date of Birth: [birth year]  Transition of Care [facility] CM/SW Contact:  Chapman Fitch, RN Phone Number: 12/17/2022, 11:25 AM   Clinical Narrative:     Per MD patient to discharge today Met with patient at bedside Patient states that she has been living in her car for over a year Patient states that she stays in the [facility], but that she came to [facility] to get her hair done  Patient states she plans to return to her car at discharge Added housing/shelter, and utility information to AVS  Patient states that she is currently in between jobs, but is in the process of putting in applications .  PCP Caffie Damme and patient states that she is current with her PCP  Dc medications to be delivered to patient via meds to bed prior to discharge. .  MD, Bed side Rn and  pharmacist  aware         Patient Goals and CMS Choice            Discharge Placement                       Discharge Plan and Services Additional resources added to the After Visit Summary for                                       Social Drivers of Health (SDOH) Interventions SDOH Screenings   Food Insecurity: Food Insecurity Present (12/16/2022)  Housing: High Risk (12/16/2022)  Transportation Needs: No Transportation Needs (12/16/2022)  Utilities: At Risk (12/16/2022)  Depression (PHQ2-9): Low Risk  (10/29/2018)  Physical Activity: Sufficiently Active (12/30/2018)   Received from [facility], [facility]  Stress: Stress Concern Present (12/30/2018)   Received from [facility], [facility]  Tobacco Use: Low Risk  (12/15/2022)     Readmission Risk Interventions     No data to display

## 2022-12-23 ENCOUNTER — Encounter (HOSPITAL_BASED_OUTPATIENT_CLINIC_OR_DEPARTMENT_OTHER): Payer: Self-pay | Admitting: Emergency Medicine

## 2022-12-23 ENCOUNTER — Other Ambulatory Visit: Payer: Self-pay

## 2022-12-23 ENCOUNTER — Emergency Department (HOSPITAL_BASED_OUTPATIENT_CLINIC_OR_DEPARTMENT_OTHER)
Admission: EM | Admit: 2022-12-23 | Discharge: 2022-12-23 | Disposition: A | Discharge status: Home / Self Care | Payer: Medicaid Other | Attending: Emergency Medicine | Admitting: Emergency Medicine

## 2022-12-23 DIAGNOSIS — Z5900 Homelessness unspecified: Secondary | ICD-10-CM | POA: Diagnosis not present

## 2022-12-23 DIAGNOSIS — S8012XA Contusion of left lower leg, initial encounter: Secondary | ICD-10-CM | POA: Insufficient documentation

## 2022-12-23 DIAGNOSIS — Z7901 Long term (current) use of anticoagulants: Secondary | ICD-10-CM | POA: Insufficient documentation

## 2022-12-23 DIAGNOSIS — X58XXXA Exposure to other specified factors, initial encounter: Secondary | ICD-10-CM | POA: Insufficient documentation

## 2022-12-23 DIAGNOSIS — M79605 Pain in left leg: Secondary | ICD-10-CM | POA: Diagnosis present

## 2022-12-23 HISTORY — DX: Other pulmonary embolism without acute cor pulmonale: I26.99

## 2022-12-23 LAB — MAGNESIUM: Magnesium: 2 mg/dL (ref 1.7–2.4)

## 2022-12-23 LAB — CBC WITH DIFFERENTIAL/PLATELET
Abs Immature Granulocytes: 0.03 10*3/uL (ref 0.00–0.07)
Basophils Absolute: 0 10*3/uL (ref 0.0–0.1)
Basophils Relative: 1 %
Eosinophils Absolute: 0.1 10*3/uL (ref 0.0–0.5)
Eosinophils Relative: 3 %
HCT: 35.2 % — ABNORMAL LOW (ref 36.0–46.0)
Hemoglobin: 11.7 g/dL — ABNORMAL LOW (ref 12.0–15.0)
Immature Granulocytes: 1 %
Lymphocytes Relative: 22 %
Lymphs Abs: 1.2 10*3/uL (ref 0.7–4.0)
MCH: 30.3 pg (ref 26.0–34.0)
MCHC: 33.2 g/dL (ref 30.0–36.0)
MCV: 91.2 fL (ref 80.0–100.0)
Monocytes Absolute: 0.4 10*3/uL (ref 0.1–1.0)
Monocytes Relative: 7 %
Neutro Abs: 3.9 10*3/uL (ref 1.7–7.7)
Neutrophils Relative %: 66 %
Platelets: 286 10*3/uL (ref 150–400)
RBC: 3.86 MIL/uL — ABNORMAL LOW (ref 3.87–5.11)
RDW: 13.8 % (ref 11.5–15.5)
WBC: 5.7 10*3/uL (ref 4.0–10.5)
nRBC: 0 % (ref 0.0–0.2)

## 2022-12-23 LAB — BASIC METABOLIC PANEL
Anion gap: 7 (ref 5–15)
BUN: 17 mg/dL (ref 6–20)
CO2: 25 mmol/L (ref 22–32)
Calcium: 8.6 mg/dL — ABNORMAL LOW (ref 8.9–10.3)
Chloride: 107 mmol/L (ref 98–111)
Creatinine, Ser: 0.84 mg/dL (ref 0.44–1.00)
GFR, Estimated: >60 mL/min (ref >60)
Glucose, Bld: 97 mg/dL (ref 70–99)
Potassium: 3.8 mmol/L (ref 3.5–5.1)
Sodium: 139 mmol/L (ref 135–145)

## 2022-12-23 LAB — CK: Total CK: 128 U/L (ref 38–234)

## 2022-12-23 LAB — BRAIN NATRIURETIC PEPTIDE: B Natriuretic Peptide: 18.1 pg/mL (ref 0.0–100.0)

## 2022-12-23 MED ORDER — HYDROCODONE-ACETAMINOPHEN 5-325 MG PO TABS
1.0000 | ORAL_TABLET | Freq: Once | ORAL | Status: AC
  Administered 2022-12-23: 1 via ORAL
  Filled 2022-12-23: qty 1

## 2022-12-23 NOTE — ED Triage Notes (Signed)
Pt reports recent dx of PE and started on Eliquis. She states her L leg and her back are still hurting. "It feels like the pain is traveling". She also complains of being bloated and gassy.

## 2022-12-24 NOTE — ED Provider Notes (Signed)
[place] [facility]MENT AT [facility] Provider Note   CSN: [id number] Arrival date & time: 12/23/22  0414     History  Chief Complaint  Patient presents with   Leg Pain    Kacee Zatoria Gonder is a 57 y.o. female.  57 year old female who presents ER today secondary to pain.  Patient states she was just admitted to the hospital for PE.  She Gaster on Eliquis.  She states that she has persistent left leg, left hip, left side pain also left-sided headache since leaving the hospital.  She states that she is homeless and sleeps in her car and thinks that might contribute.  She states that she is not able to sleep very well because she has to sleep sitting up in the car.  No new trauma.  No fevers.  No worsening shortness of breath.  No new lower extremity swelling.   Leg Pain      Home Medications Prior to Admission medications   Medication Sig Start Date End Date Taking? Authorizing Provider  APIXABAN (ELIQUIS) VTE STARTER PACK (10MG  AND 5MG ) Take as directed on package: start with two-5mg  tablets twice daily for 7 days. On day 8, switch to one-5mg  tablet twice daily. 12/17/22   [place], Alvira Philips, DO  citalopram (CELEXA) 20 MG tablet Take 1 tablet (20 mg total) by mouth daily. 12/17/22 03/17/23  [place], Alvira Philips, DO  esomeprazole (NEXIUM) 20 MG capsule Take 1 capsule (20 mg total) by mouth daily at 12 noon. 12/17/22 03/17/23  [place], Alvira Philips, DO  hydrochlorothiazide (HYDRODIURIL) 25 MG tablet Take 1 tablet (25 mg total) by mouth daily. 12/17/22 03/17/23  [place], Eric J, DO      Allergies    Percocet [oxycodone-acetaminophen]    Review of Systems   Review of Systems  Physical Exam Updated Vital Signs BP 117/72 (BP Location: Left Arm)   Pulse 62   Temp 97.9 F (36.6 C) (Oral)   Resp 20   Ht 6' (1.829 m)   Wt 120.2 kg   SpO2 99%   BMI 35.94 kg/m  Physical Exam Vitals and nursing note reviewed.  Constitutional:      Appearance: She is well-developed.   HENT:     Head: Normocephalic and atraumatic.     Mouth/Throat:     Mouth: Mucous membranes are moist.  Cardiovascular:     Rate and Rhythm: Normal rate and regular rhythm.  Pulmonary:     Effort: No respiratory distress.     Breath sounds: No stridor.  Abdominal:     General: There is no distension.  Musculoskeletal:        General: Normal range of motion.     Cervical back: Normal range of motion.  Skin:    General: Skin is warm and dry.     Comments: 1 small bruise to the lateral side of her left leg just below the knee.  Neurological:     Mental Status: She is alert.     ED Results / Procedures / Treatments   Labs (all labs ordered are listed, but only abnormal results are displayed) Labs Reviewed  CBC WITH DIFFERENTIAL/PLATELET - Abnormal; Notable for the following components:      Result Value   RBC 3.86 (*)    Hemoglobin 11.7 (*)    HCT 35.2 (*)    All other components within normal limits  BASIC METABOLIC PANEL - Abnormal; Notable for the following components:   Calcium 8.6 (*)  All other components within normal limits  CK  BRAIN NATRIURETIC PEPTIDE  MAGNESIUM    EKG None  Radiology No results found.  Procedures Procedures    Medications Ordered in ED Medications  HYDROcodone-acetaminophen (NORCO/VICODIN) 5-325 MG per tablet 1 tablet (1 tablet Oral Given 12/23/22 1062)    ED Course/ Medical Decision Making/ A&P                                 Medical Decision Making Amount and/or Complexity of Data Reviewed Labs: ordered.  Risk Prescription drug management.   No obvious edema on evaluation.  I reviewed her records from her recent hospitalization she did not have a DVT there and her PE were minimal.  She has normal vital signs here I do not think she has any worsening of her symptoms especially on Eliquis.  I suspect that her per perceived swelling and discomfort is likely from sleeping in the car sit upright.  I encouraged trying to find  a way to sleep more supine or with her legs elevated if not the baby wear compression stockings for the time being but also encouraged ambulation as that would help with the swelling and to ensure she does not develop other blood clots.  I do not see any indication for further imaging to evaluate the same.  She has good pulses I think she has low likelihood of cerulea at this time.  Final Clinical Impression(s) / ED Diagnoses Final diagnoses:  Left leg pain    Rx / DC Orders ED Discharge Orders     None         Tyera Hansley, Barbara Cower, MD 12/24/22 [phone]

## 2023-02-14 ENCOUNTER — Ambulatory Visit: Payer: Medicaid Other | Admitting: Urology

## 2023-02-23 ENCOUNTER — Other Ambulatory Visit: Payer: Self-pay

## 2023-02-23 ENCOUNTER — Emergency Department (HOSPITAL_COMMUNITY): Payer: Medicaid Other

## 2023-02-23 ENCOUNTER — Emergency Department (HOSPITAL_COMMUNITY)
Admission: EM | Admit: 2023-02-23 | Discharge: 2023-02-23 | Disposition: A | Discharge status: Home / Self Care | Payer: Medicaid Other | Attending: Emergency Medicine | Admitting: Emergency Medicine

## 2023-02-23 DIAGNOSIS — Z86711 Personal history of pulmonary embolism: Secondary | ICD-10-CM | POA: Insufficient documentation

## 2023-02-23 DIAGNOSIS — W07XXXA Fall from chair, initial encounter: Secondary | ICD-10-CM | POA: Insufficient documentation

## 2023-02-23 DIAGNOSIS — S0990XA Unspecified injury of head, initial encounter: Secondary | ICD-10-CM | POA: Diagnosis present

## 2023-02-23 DIAGNOSIS — Z7901 Long term (current) use of anticoagulants: Secondary | ICD-10-CM | POA: Insufficient documentation

## 2023-02-23 DIAGNOSIS — Z79899 Other long term (current) drug therapy: Secondary | ICD-10-CM | POA: Insufficient documentation

## 2023-02-23 DIAGNOSIS — R079 Chest pain, unspecified: Secondary | ICD-10-CM | POA: Diagnosis not present

## 2023-02-23 DIAGNOSIS — I10 Essential (primary) hypertension: Secondary | ICD-10-CM | POA: Insufficient documentation

## 2023-02-23 DIAGNOSIS — W19XXXA Unspecified fall, initial encounter: Secondary | ICD-10-CM

## 2023-02-23 DIAGNOSIS — Y9289 Other specified places as the place of occurrence of the external cause: Secondary | ICD-10-CM | POA: Insufficient documentation

## 2023-02-23 DIAGNOSIS — M940 Chondrocostal junction syndrome [Tietze]: Secondary | ICD-10-CM

## 2023-02-23 LAB — COMPREHENSIVE METABOLIC PANEL
ALT: 21 U/L (ref 0–44)
AST: 21 U/L (ref 15–41)
Albumin: 3.7 g/dL (ref 3.5–5.0)
Alkaline Phosphatase: 81 U/L (ref 38–126)
Anion gap: 11 (ref 5–15)
BUN: 9 mg/dL (ref 6–20)
CO2: 21 mmol/L — ABNORMAL LOW (ref 22–32)
Calcium: 9.1 mg/dL (ref 8.9–10.3)
Chloride: 108 mmol/L (ref 98–111)
Creatinine, Ser: 0.77 mg/dL (ref 0.44–1.00)
GFR, Estimated: >60 mL/min (ref >60)
Glucose, Bld: 142 mg/dL — ABNORMAL HIGH (ref 70–99)
Potassium: 3.4 mmol/L — ABNORMAL LOW (ref 3.5–5.1)
Sodium: 140 mmol/L (ref 135–145)
Total Bilirubin: 0.9 mg/dL (ref 0.0–1.2)
Total Protein: 6.6 g/dL (ref 6.5–8.1)

## 2023-02-23 LAB — CBC WITH DIFFERENTIAL/PLATELET
Abs Immature Granulocytes: 0.03 10*3/uL (ref 0.00–0.07)
Basophils Absolute: 0 10*3/uL (ref 0.0–0.1)
Basophils Relative: 0 %
Eosinophils Absolute: 0 10*3/uL (ref 0.0–0.5)
Eosinophils Relative: 0 %
HCT: 36.2 % (ref 36.0–46.0)
Hemoglobin: 12.3 g/dL (ref 12.0–15.0)
Immature Granulocytes: 0 %
Lymphocytes Relative: 18 %
Lymphs Abs: 1.3 10*3/uL (ref 0.7–4.0)
MCH: 30.8 pg (ref 26.0–34.0)
MCHC: 34 g/dL (ref 30.0–36.0)
MCV: 90.5 fL (ref 80.0–100.0)
Monocytes Absolute: 0.3 10*3/uL (ref 0.1–1.0)
Monocytes Relative: 5 %
Neutro Abs: 5.4 10*3/uL (ref 1.7–7.7)
Neutrophils Relative %: 77 %
Platelets: 285 10*3/uL (ref 150–400)
RBC: 4 MIL/uL (ref 3.87–5.11)
RDW: 14.2 % (ref 11.5–15.5)
WBC: 7 10*3/uL (ref 4.0–10.5)
nRBC: 0 % (ref 0.0–0.2)

## 2023-02-23 LAB — TROPONIN I (HIGH SENSITIVITY)
Troponin I (High Sensitivity): 3 ng/L (ref <18)
Troponin I (High Sensitivity): 3 ng/L (ref <18)

## 2023-02-23 MED ORDER — HYDROCODONE-ACETAMINOPHEN 5-325 MG PO TABS
1.0000 | ORAL_TABLET | Freq: Once | ORAL | Status: AC
  Administered 2023-02-23: 1 via ORAL
  Filled 2023-02-23: qty 1

## 2023-02-23 MED ORDER — LIDOCAINE 5 % EX PTCH
3.0000 | MEDICATED_PATCH | CUTANEOUS | Status: DC
Start: 2023-02-23 — End: 2023-02-24
  Administered 2023-02-23: 3 via TRANSDERMAL
  Filled 2023-02-23: qty 3

## 2023-02-23 MED ORDER — LIDOCAINE 5 % EX PTCH
1.0000 | MEDICATED_PATCH | CUTANEOUS | 0 refills | Status: DC
  Filled 2023-02-23: qty 20, 20d supply, fill #0

## 2023-02-23 MED ORDER — LIDOCAINE 5 % EX PTCH
1.0000 | MEDICATED_PATCH | CUTANEOUS | 0 refills | Status: DC
Start: 2023-02-23 — End: 2023-04-23
  Filled 2023-02-23: qty 20, 20d supply, fill #0

## 2023-02-23 NOTE — ED Notes (Signed)
 PT ambulated with steady gait around nurses station. Attempting to use bleach wipes. Pt was redirected back to her bed.

## 2023-02-23 NOTE — ED Triage Notes (Signed)
 Pt presents from shelter for fall on eliquis while in shower today. Endorses L leg pain and low back pain.  Denies neck pain. Pt states to RN that eliquis was prescribed for PE

## 2023-02-23 NOTE — ED Notes (Signed)
 Pt reports her pain is unchanged.

## 2023-02-23 NOTE — ED Provider Triage Note (Signed)
 Emergency Medicine Provider Triage Evaluation Note  Carmen Barr , a 58 y.o. female  was evaluated in triage.  Pt complains of fall on thinners.  Review of Systems  Positive:  Negative:   Physical Exam  Pulse 93   Temp 99.2 F (37.3 C) (Oral)  Gen:   Awake, no distress   Resp:  Normal effort  MSK:   Moves extremities without difficulty  Other:    Medical Decision Making  Medically screening exam initiated at 2:31 PM.  Appropriate orders placed.  Carmen Barr was informed that the remainder of the evaluation will be completed by another provider, this initial triage assessment does not replace that evaluation, and the importance of remaining in the ED until their evaluation is complete.  Slip and fall in shower. On blood thinners. Endorses head trauma. Denies LOC. Complaining of generalized left sided pain, only thinks that she needs xrays for lumbar, chest, and left lower leg. States that she is having chest pain.   Dorthy Cooler, [place] 02/23/23 1432
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
## 2023-02-23 NOTE — Discharge Instructions (Addendum)
 Thank you for letting us evaluate you today.  Your CT scan was negative for intracranial hemorrhage.  Your cervical neck was negative for fracture.  Your chest x-ray was negative for fracture or intra cardiopulmonary pathology.  Your x-ray of your lower leg was negative for fracture.  I have sent lidocaine patches to your pharmacy.  Please use Tylenol in addition to these patches for pain.  You may also try Voltaren gel which is a topical "ibuprofen" that will not affect your kidneys or blood thinner.  You may also try heat/ice in areas of pain.  Although we are unable to know if you had a concussion as these do not show up on imaging.  Please make sure to get plenty of sleep, screen rest to help heal your brain in case you did have a concussion.  Return to emergency department if you experience altered mentation, seizures

## 2023-02-23 NOTE — ED Notes (Signed)
 Bathroom call light went off. RN and tech went to door. Opened door and pt on floor sitting on bottom. States she got dizzy and lowered herself to ground. PA made aware. Pt wheeled back to bed.

## 2023-02-23 NOTE — ED Notes (Signed)
 Px switched to [facility] by PA per patient request. Registration assisted with getting her set up with mychart per her request.

## 2023-02-25 ENCOUNTER — Other Ambulatory Visit (HOSPITAL_BASED_OUTPATIENT_CLINIC_OR_DEPARTMENT_OTHER): Payer: Self-pay

## 2023-02-27 ENCOUNTER — Other Ambulatory Visit (HOSPITAL_BASED_OUTPATIENT_CLINIC_OR_DEPARTMENT_OTHER): Payer: Self-pay

## 2023-02-27 MED ORDER — ELIQUIS 5 MG PO TABS
5.0000 mg | ORAL_TABLET | Freq: Two times a day (BID) | ORAL | 5 refills | Status: AC
  Filled 2023-02-27: qty 60, 30d supply, fill #0

## 2023-03-07 ENCOUNTER — Other Ambulatory Visit (HOSPITAL_BASED_OUTPATIENT_CLINIC_OR_DEPARTMENT_OTHER): Payer: Self-pay

## 2023-04-23 ENCOUNTER — Ambulatory Visit
Admission: EM | Admit: 2023-04-23 | Discharge: 2023-04-23 | Disposition: A | Discharge status: Home / Self Care | Attending: Emergency Medicine | Admitting: Emergency Medicine

## 2023-04-23 DIAGNOSIS — R109 Unspecified abdominal pain: Secondary | ICD-10-CM | POA: Diagnosis present

## 2023-04-23 DIAGNOSIS — N3001 Acute cystitis with hematuria: Secondary | ICD-10-CM | POA: Insufficient documentation

## 2023-04-23 HISTORY — DX: Obstructive sleep apnea (adult) (pediatric): G47.33

## 2023-04-23 HISTORY — DX: Diverticulitis of intestine, part unspecified, without perforation or abscess without bleeding: K57.92

## 2023-04-23 HISTORY — DX: Homelessness unspecified: Z59.00

## 2023-04-23 HISTORY — DX: Calculus of gallbladder without cholecystitis without obstruction: K80.20

## 2023-04-23 HISTORY — DX: Unspecified convulsions: R56.9

## 2023-04-23 HISTORY — DX: Endometriosis, unspecified: N80.9

## 2023-04-23 LAB — POCT URINALYSIS DIP (MANUAL ENTRY)
Bilirubin, UA: NEGATIVE
Glucose, UA: NEGATIVE mg/dL
Ketones, POC UA: NEGATIVE mg/dL
Leukocytes, UA: NEGATIVE
Nitrite, UA: NEGATIVE
Protein Ur, POC: NEGATIVE mg/dL
Spec Grav, UA: 1.015 (ref 1.010–1.025)
Urobilinogen, UA: 0.2 U/dL
pH, UA: 7.5 (ref 5.0–8.0)

## 2023-04-23 MED ORDER — KETOROLAC TROMETHAMINE 30 MG/ML IJ SOLN
30.0000 mg | Freq: Once | INTRAMUSCULAR | Status: AC
  Administered 2023-04-23: 30 mg via INTRAMUSCULAR

## 2023-04-23 MED ORDER — NAPROXEN 500 MG PO TABS: 500.0000 mg | ORAL_TABLET | Freq: Two times a day (BID) | ORAL | 0 refills | Status: DC

## 2023-04-23 MED ORDER — CIPROFLOXACIN HCL 500 MG PO TABS: 500.0000 mg | ORAL_TABLET | Freq: Two times a day (BID) | ORAL | 0 refills | Status: DC

## 2023-04-23 MED ORDER — CEFTRIAXONE SODIUM 1 G IJ SOLR
1.0000 g | Freq: Once | INTRAMUSCULAR | Status: AC
  Administered 2023-04-23: 1 g via INTRAMUSCULAR

## 2023-04-23 MED ORDER — CIPROFLOXACIN HCL 500 MG PO TABS: 500.0000 mg | ORAL_TABLET | Freq: Two times a day (BID) | ORAL | 0 refills | Status: AC

## 2023-04-23 MED ORDER — ACETAMINOPHEN 500 MG PO TABS: 1000.0000 mg | ORAL_TABLET | Freq: Three times a day (TID) | ORAL | 0 refills | Status: DC | PRN

## 2023-04-23 NOTE — Discharge Instructions (Addendum)
 Common causes of urinary tract infections include but are not limited to holding your urine longer than you should, squatting instead of sitting down when urinating, sitting around in wet clothing such as a wet swimsuit or gym clothes too long, not emptying your bladder after having sexual intercourse, wiping from back to front instead of front to back after having a bowel movement.     Less common causes of urinary tract infections include but are not limited to anatomical shifts in the location of your bladder or uterus causing obstruction of passage of urine from your bladder to your urethra where your urine comes out or prolapse of your rectum into your vaginal wall.  These less common causes can be evaluated by gynecologist, a urologist or subspecialist called a uro-gynecologist   The urinalysis that we performed in the clinic today was abnormal.  Urine culture will be performed per our protocol.  The result of the urine culture will be available in the next 3 to 5 days and will be posted to your MyChart account.  If there is an abnormal finding, you will be contacted by phone and advised of further treatment recommendations, if any.   You were advised to begin antibiotics today because your urinalysis is abnormal and you are having active symptoms of an acute lower urinary tract infection also known as cystitis.  It is very important that you take all doses exactly as prescribed.  Incomplete antibiotic therapy can cause worsening urinary tract infection that can become aggressive, escape from urinary tract into your bloodstream causing sepsis which will require hospitalization.   Please pick up and begin taking your prescription for Ciprofloxacin  500 mg as soon as possible.  Please take all doses exactly as prescribed.  You can take this medication with or without food.  This medication is safe to take with your other medications.  During your visit today, you received an injection of a strong  antibiotic called ceftriaxone  during your today to help expedite resolution of your urinary tract infection.  You also received an injection of ketorolac  for your pain.   I have also sent in a prescription for naproxen  500 mg tablets that you can take twice daily and as needed for pain.  Your first dose can be taken this evening.   If you receive a phone call advising you that your urine culture is negative but you begin to feel better after taking antibiotics for 24 hours, please feel free to complete the full course of antibiotics as they were likely needed and the urine culture result was false.  If your culture is negative and you do not feel any better, please return for repeat evaluation of other possible causes of your symptoms.   If you have not had complete resolution of your symptoms after completing treatment as prescribed, please return to urgent care for repeat evaluation or follow-up with your primary care provider.  Repeat urinalysis and urine culture may be indicated for more directed therapy.   Thank you for visiting [facility] today.  We appreciate the opportunity to participate in your care.

## 2023-04-23 NOTE — ED Triage Notes (Signed)
 Today she started having left eye swelling, right flank pain, and urinary frequency.  Since she hit her head when she fell fall on 2/22 she has had confusion, increased panic attacks, and headaches.   Since her fall she was put on blood thinners and having shortness of breath.   She has been having left arm and leg pain for a few months. The pain increased after the fall. She took Hydrocodone  due to her leg pain two days ago.

## 2023-04-23 NOTE — ED Provider Notes (Addendum)
 [facility]    CSN: [id number] Arrival date & time: 04/23/23  0909    HISTORY   Chief Complaint  Patient presents with   Flank Pain   HPI Carmen Barr is a pleasant, 58 y.o. female who presents to urgent care today. Patient complains of possible urinary tract infection.  Patient endorses increased frequency of urination and right flank pain.  Patient denies abnormal vaginal discharge, abnormal vaginal odor, vaginal itching, vaginal irritation, burning during urination, sensation of incomplete emptying, body aches, chills, rigors, malaise, and significant fatigue. Pt has temp of 100.1 on arrival, BP is elevated, the hydrochlorothiazide  that was prescribed for her in December of last year is listed as never dispensed however patient states she is taking it daily.  Patient also reports continued confusion, headaches, panic attacks, left arm and leg pain over the past few months.  Patient has already been seen in the emergency department for all of these complaints.  Pt states she is currently living in her car.  Patient has a history of pulmonary embolism, currently taking Eliquis  for this.  The history is provided by the patient.    Past Medical History:  Diagnosis Date   Depression with anxiety    Diverticulitis    Endometriosis    Gallstones    Homeless    HTN (hypertension)    Obesity (BMI 30-39.9)    OSA (obstructive sleep apnea)    Pulmonary embolism (HCC)    Seizures (HCC)    Patient Active Problem List   Diagnosis Date Noted   Acute pulmonary embolism (HCC) 12/15/2022   Hypokalemia 12/15/2022   Homeless 12/15/2022   Depression with anxiety    Obesity (BMI 30-39.9)    Essential hypertension    Macromastia 12/05/2018   Breast pain 12/05/2018   Hematuria of undiagnosed cause 05/27/2018   Onychomycosis 12/16/2017   Hammer toes of both feet 12/16/2017   Hallux valgus with bunions, left 12/16/2017   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 Authorizing Provider  escitalopram  (LEXAPRO ) 20 MG tablet Take 20 mg by mouth daily. 03/30/23  Yes [provider]  HYDROcodone -acetaminophen  (NORCO) 10-325 MG tablet Take by mouth. 12/24/22  Yes [provider]  HYDROcodone -acetaminophen  (NORCO) 10-325 MG tablet Take by mouth. 03/29/23  Yes [provider]  apixaban  (ELIQUIS ) 5 MG TABS tablet Take 1 tablet (5 mg total) by mouth 2 (two) times daily. Start after completing starter kit 02/27/23     APIXABAN  (ELIQUIS ) VTE STARTER PACK (10MG  AND 5MG ) Take as directed on package: start with two-5mg  tablets twice daily for 7 days. On day 8, switch to one-5mg  tablet twice daily. 12/17/22   [place], Rema Care, DO  citalopram  (CELEXA ) 20 MG tablet Take 1 tablet (  20 mg total) by mouth daily. 12/17/22 03/17/23  [place], Eric J, DO  esomeprazole  (NEXIUM ) 20 MG capsule Take 1 capsule (20 mg total) by mouth daily at 12 noon. 12/17/22 03/17/23  [place], Rema Care, DO  hydrochlorothiazide  (HYDRODIURIL ) 25 MG tablet Take 1 tablet (25 mg total) by mouth daily. 12/17/22 03/17/23  [place], Rema Care, DO  lidocaine  (LIDODERM ) 5 % Place 1 patch onto the skin daily. Remove & Discard patch within 12 hours or as directed by MD. 02/23/23   Royann Cords, PA    Family History Family History  Problem Relation Age of Onset   Breast cancer Maternal Grandmother 12   Breast cancer Cousin        mat cousin   Social History Social History   Tobacco Use   Smoking status: Never   Smokeless  tobacco: Never  Vaping Use   Vaping status: Never Used  Substance Use Topics   Alcohol use: Not Currently   Drug use: Never   Allergies   Percocet [oxycodone-acetaminophen ]  Review of Systems Review of Systems Pertinent findings revealed after performing a 14 point review of systems has been noted in the history of present illness.  Physical Exam Vital Signs BP (!) 156/101 (BP Location: Right Arm)   Pulse (!) 58   Temp 100.1 F (37.8 C) (Oral)   Resp 15   SpO2 96%   No data found.  Physical Exam Vitals and nursing note reviewed.  Constitutional:      General: She is not in acute distress.    Appearance: Normal appearance. She is not ill-appearing.  HENT:     Head: Normocephalic and atraumatic.  Eyes:     General: Lids are normal.        Right eye: No discharge.        Left eye: No discharge.     Extraocular Movements: Extraocular movements intact.     Conjunctiva/sclera: Conjunctivae normal.     Right eye: Right conjunctiva is not injected.     Left eye: Left conjunctiva is not injected.  Neck:     Trachea: Trachea and phonation normal.  Cardiovascular:     Rate and Rhythm: Normal rate and regular rhythm.     Pulses: Normal pulses.     Heart sounds: Normal heart sounds. No murmur heard.    No friction rub. No gallop.  Pulmonary:     Effort: Pulmonary effort is normal. No accessory muscle usage, prolonged expiration or respiratory distress.     Breath sounds: Normal breath sounds. No stridor, decreased air movement or transmitted upper airway sounds. No decreased breath sounds, wheezing, rhonchi or rales.  Chest:     Chest wall: No tenderness.  Abdominal:     General: Abdomen is flat. Bowel sounds are normal. There is no distension.     Palpations: Abdomen is soft.     Tenderness: There is abdominal tenderness in the suprapubic area. There is right CVA tenderness. There is no left CVA tenderness.     Hernia: No hernia is present.  Musculoskeletal:         General: Normal range of motion.     Left shoulder: Normal.     Left upper arm: Normal.     Cervical back: Normal range of motion and neck supple. Normal range of motion.     Lumbar back: Spasms and tenderness present.     Left hip: Tenderness present.  Lymphadenopathy:     Cervical: No cervical adenopathy.  Skin:  General: Skin is warm and dry.     Findings: No erythema or rash.  Neurological:     General: No focal deficit present.     Mental Status: She is alert and oriented to person, place, and time.  Psychiatric:        Mood and Affect: Mood normal.        Behavior: Behavior normal.     Visual Acuity Right Eye Distance:   Left Eye Distance:   Bilateral Distance:    Right Eye Near:   Left Eye Near:    Bilateral Near:     UC Couse / Diagnostics / Procedures:     Radiology No results found.  Procedures Procedures (including critical care time) EKG  Pending results:  Labs Reviewed  POCT URINALYSIS DIP (MANUAL ENTRY) - Abnormal; Notable for the following components:      Result Value   Blood, UA moderate (*)    All other components within normal limits  URINE CULTURE    Medications Ordered in UC: Medications  cefTRIAXone  (ROCEPHIN ) injection 1 g (1 g Intramuscular Given 04/23/23 1011)  ketorolac  (TORADOL ) 30 MG/ML injection 30 mg (30 mg Intramuscular Given 04/23/23 1010)    UC Diagnoses / Final Clinical Impressions(s)   I have reviewed the triage vital signs and the nursing notes.  Pertinent labs & imaging results that were available during my care of the patient were reviewed by me and considered in my medical decision making (see chart for details).    Final diagnoses:  Acute right flank pain  Acute cystitis with hematuria   Urine dip today revealed hematuria to a large degree.   Patient was advised to begin antibiotics now due to findings on urine dip. Patient was advised to begin antibiotics today due to having active symptoms of urinary tract  infection.                Patient was provided with an injection of ceftriaxone  given right flank pain and elevated temperature on arrival concerning for possible complicated urinary tract infection.     Patient was advised to take all doses of ciprofloxacin  exactly as prescribed.  Patient also advised of risks of worsening infection with incomplete antibiotic therapy. Patient advised that they will be contacted with results and that adjustments to treatment will be provided as indicated based on the results.   Patient was advised of possibility that urine culture results may be negative if sample provided was obtained late in the day causing urine to be more diluted.  Patient was advised that if antibiotics were effective after the first 24 to 36 hours, despite negative urine culture result, it is recommended that they complete the full course as prescribed.   Patient was provided with an injection of ketorolac  for relief of lower back and hip pain, Tylenol  1000 mg prescription provided for continued pain relief. Return precautions advised.  Please see discharge instructions below for details of plan of care as provided to patient. ED Prescriptions     Medication Sig Dispense Auth. Provider   ciprofloxacin  (CIPRO ) 500 MG tablet Take 1 tablet (500 mg total) by mouth every 12 (twelve) hours for 7 days. 14 tablet Eloise Hake Scales, PA-C   acetaminophen  (TYLENOL ) 500 MG tablet Take 2 tablets (1,000 mg total) by mouth every 8 (eight) hours as needed for up to 30 doses for mild pain (pain score 1-3) or fever. 60 tablet Eloise Hake Scales, PA-C      PDMP not reviewed this  encounter.  Disposition Upon Discharge:  Condition: stable for discharge home  Patient presented with concern for an acute illness with associated systemic symptoms and significant discomfort requiring urgent management. In my opinion, this is a condition that a prudent lay person (someone who possesses an average knowledge  of health and medicine) may potentially expect to result in complications if not addressed urgently such as respiratory distress, impairment of bodily function or dysfunction of bodily organs.   As such, the patient has been evaluated and assessed, work-up was performed and treatment was provided in alignment with urgent care protocols and evidence based medicine.  Patient/parent/caregiver has been advised that the patient may require follow up for further testing and/or treatment if the symptoms continue in spite of treatment, as clinically indicated and appropriate.  Routine symptom specific, illness specific and/or disease specific instructions were discussed with the patient and/or caregiver at length.  Prevention strategies for avoiding STD exposure were also discussed.  The patient will follow up with their current PCP if and as advised. If the patient does not currently have a PCP we will assist them in obtaining one.   The patient may need specialty follow up if the symptoms continue, in spite of conservative treatment and management, for further workup, evaluation, consultation and treatment as clinically indicated and appropriate.  Patient/parent/caregiver verbalized understanding and agreement of plan as discussed.  All questions were addressed during visit.  Please see discharge instructions below for further details of plan.    Discharge Instructions      Common causes of urinary tract infections include but are not limited to holding your urine longer than you should, squatting instead of sitting down when urinating, sitting around in wet clothing such as a wet swimsuit or gym clothes too long, not emptying your bladder after having sexual intercourse, wiping from back to front instead of front to back after having a bowel movement.     Less common causes of urinary tract infections include but are not limited to anatomical shifts in the location of your bladder or uterus causing  obstruction of passage of urine from your bladder to your urethra where your urine comes out or prolapse of your rectum into your vaginal wall.  These less common causes can be evaluated by gynecologist, a urologist or subspecialist called a uro-gynecologist   The urinalysis that we performed in the clinic today was abnormal.  Urine culture will be performed per our protocol.  The result of the urine culture will be available in the next 3 to 5 days and will be posted to your MyChart account.  If there is an abnormal finding, you will be contacted by phone and advised of further treatment recommendations, if any.   You were advised to begin antibiotics today because your urinalysis is abnormal and you are having active symptoms of an acute lower urinary tract infection also known as cystitis.  It is very important that you take all doses exactly as prescribed.  Incomplete antibiotic therapy can cause worsening urinary tract infection that can become aggressive, escape from urinary tract into your bloodstream causing sepsis which will require hospitalization.   Please pick up and begin taking your prescription for Ciprofloxacin  500 mg as soon as possible.  Please take all doses exactly as prescribed.  You can take this medication with or without food.  This medication is safe to take with your other medications.  During your visit today, you received an injection of a strong antibiotic called ceftriaxone   during your today to help expedite resolution of your urinary tract infection.  You also received an injection of ketorolac  for your pain.   I have also sent in a prescription for naproxen  500 mg tablets that you can take twice daily and as needed for pain.  Your first dose can be taken this evening.   If you receive a phone call advising you that your urine culture is negative but you begin to feel better after taking antibiotics for 24 hours, please feel free to complete the full course of antibiotics  as they were likely needed and the urine culture result was false.  If your culture is negative and you do not feel any better, please return for repeat evaluation of other possible causes of your symptoms.   If you have not had complete resolution of your symptoms after completing treatment as prescribed, please return to urgent care for repeat evaluation or follow-up with your primary care provider.  Repeat urinalysis and urine culture may be indicated for more directed therapy.   Thank you for visiting [facility] today.  We appreciate the opportunity to participate in your care.       This office note has been dictated using Teaching laboratory technician.  Unfortunately, this method of dictation can sometimes lead to typographical or grammatical errors.  I apologize for your inconvenience in advance if this occurs.  Please do not hesitate to reach out to me if clarification is needed.       Eloise Hake Scales, PA-C 04/23/23 1013    Eloise Hake [place], [place] 04/23/23 1019

## 2023-04-24 LAB — URINE CULTURE: Culture: <10000 — AB

## 2023-04-28 ENCOUNTER — Ambulatory Visit
Admission: EM | Admit: 2023-04-28 | Discharge: 2023-04-28 | Disposition: A | Discharge status: Home / Self Care | Attending: Emergency Medicine | Admitting: Emergency Medicine

## 2023-04-28 ENCOUNTER — Encounter: Payer: Self-pay | Admitting: *Deleted

## 2023-04-28 ENCOUNTER — Ambulatory Visit

## 2023-04-28 DIAGNOSIS — M5442 Lumbago with sciatica, left side: Secondary | ICD-10-CM

## 2023-04-28 DIAGNOSIS — Z5900 Homelessness unspecified: Secondary | ICD-10-CM

## 2023-04-28 LAB — POCT URINALYSIS DIP (MANUAL ENTRY)
Bilirubin, UA: NEGATIVE
Blood, UA: NEGATIVE
Glucose, UA: NEGATIVE mg/dL
Ketones, POC UA: NEGATIVE mg/dL
Leukocytes, UA: NEGATIVE
Nitrite, UA: NEGATIVE
Protein Ur, POC: 30 mg/dL — AB
Spec Grav, UA: 1.015 (ref 1.010–1.025)
Urobilinogen, UA: 0.2 U/dL
pH, UA: 8.5 — AB (ref 5.0–8.0)

## 2023-04-28 MED ORDER — KETOROLAC TROMETHAMINE 30 MG/ML IJ SOLN
30.0000 mg | Freq: Once | INTRAMUSCULAR | Status: AC
  Administered 2023-04-28: 30 mg via INTRAMUSCULAR

## 2023-04-28 MED ORDER — ACETAMINOPHEN 500 MG PO TABS
1000.0000 mg | ORAL_TABLET | Freq: Once | ORAL | Status: AC
  Administered 2023-04-28: 1000 mg via ORAL

## 2023-04-28 MED ORDER — ACETAMINOPHEN 500 MG PO TABS: 1000.0000 mg | ORAL_TABLET | Freq: Three times a day (TID) | ORAL | 0 refills | Status: AC

## 2023-04-28 MED ORDER — METHYLPREDNISOLONE 4 MG PO TBPK: ORAL_TABLET | ORAL | 0 refills | Status: AC

## 2023-04-28 MED ORDER — BACLOFEN 10 MG PO TABS: 10.0000 mg | ORAL_TABLET | Freq: Every day | ORAL | 0 refills | Status: AC

## 2023-04-28 NOTE — ED Provider Notes (Addendum)
 [facility]    CSN: [id number] Arrival date & time: 04/28/23  4098    HISTORY   Chief Complaint  Patient presents with   Back Pain    Bilateral   HPI Carmen Barr is a pleasant, 58 y.o. female who presents to urgent care today. Patient complains of chills, bilateral "flank pain" and burning with urination.  Patient denies increased frequency of urination, burning with urination, vulvovaginal irritation, abnormal vaginal discharge, fever, chills.  Patient was seen 5 days ago by me at this location for complaints of increased frequency urination and right-sided flank pain.  On arrival, patient had a temperature of 100.1.  Urinalysis revealed hematuria, all other indices were within normal limits.  Urine culture revealed less than 10k CFU's of insignificant growth.  Patient was advised to result and, because she continued to have symptoms, completed the full 5-day course of the ciprofloxacin  prescribed for her during that visit..  Repeat urinalysis performed today was normal and not concerning for unresolved or recurrent urinary tract infection.  Patient again has elevated blood pressure on arrival, improved from previous, with otherwise normal vital signs.  Patient locates her back pain by pointing to her lower back proximal to her bilateral SI joints, states pain is also radiating down her left leg.  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  Patient also states that she is currently living in her car, sometimes able to afford to stay in an inexpensive motel using discounts provided by low income assistance programs.  Patient states that her current living situation is due to having difficulties finding  reliable, affordable housing for the past several years despite being well-educated, willing and able to work.  Patient adds that she often finds it difficult to find consistent, well- paying employment due to not having a permanent address.  The history is provided by the patient.     Complaints of  Past Medical History:  Diagnosis Date   Depression with anxiety    Diverticulitis    Endometriosis    Gallstones    Homeless    HTN (hypertension)    Obesity (BMI 30-39.9)    OSA (obstructive sleep apnea)    Pulmonary embolism (HCC)    Seizures (HCC)    Patient Active Problem List   Diagnosis Date Noted   Acute pulmonary embolism (HCC) 12/15/2022   Hypokalemia 12/15/2022   Homeless 12/15/2022   Depression with anxiety    Obesity (BMI 30-39.9)    Essential hypertension    Macromastia 12/05/2018   Breast pain 12/05/2018   Hematuria of undiagnosed cause 05/27/2018   Onychomycosis 12/16/2017   Hammer toes of both feet 12/16/2017   Hallux valgus with bunions, left 12/16/2017   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 Authorizing Provider  acetaminophen  (TYLENOL ) 500 MG tablet Take 2 tablets (1,000 mg total) by mouth every 8 (eight) hours as needed for up to 30 doses for mild pain (pain score 1-3) or fever. 04/23/23   Eloise Hake Scales, PA-C  apixaban  (ELIQUIS ) 5 MG TABS tablet Take 1 tablet (5 mg total) by mouth 2 (two) times daily. Start after completing starter kit 02/27/23     ciprofloxacin  (CIPRO ) 500  MG tablet Take 1 tablet (500 mg total) by mouth every 12 (twelve) hours for 7 days. 04/23/23 04/30/23  Eloise Hake Scales, PA-C  citalopram  (CELEXA ) 20 MG tablet Take 1 tablet (20 mg total) by mouth daily. 12/17/22 03/17/23  [place], Rema Care, DO  escitalopram  (LEXAPRO ) 20 MG tablet Take 20 mg by mouth daily. 03/30/23   [provider]  esomeprazole  (NEXIUM ) 20 MG capsule Take 1 capsule (20 mg total) by mouth daily at 12 noon. 12/17/22 03/17/23  [place], Rema Care, DO  hydrochlorothiazide  (HYDRODIURIL ) 25 MG tablet Take 1 tablet (25 mg total) by mouth daily. 12/17/22 04/23/23  [place], Eric J, DO    Family History Family History  Problem Relation Age of Onset   Breast cancer Maternal Grandmother 41   Breast cancer Cousin        mat cousin   Social History Social History   Tobacco Use   Smoking status: Never   Smokeless tobacco: Never  Vaping Use   Vaping status: Never Used  Substance Use Topics   Alcohol use: Not Currently   Drug use: Never   Allergies   Percocet [oxycodone-acetaminophen ]  Review of Systems Review of Systems Pertinent findings revealed after performing a 14 point review of systems has been noted in the history of present illness.  Physical Exam Vital Signs BP (!) 163/89 (BP Location: Right Arm)   Pulse (!) 56   Temp 97.7 F (36.5 C) (Oral)   Resp 20   Ht 6' (1.829 m)   Wt 265 lb (120.2 kg)   SpO2 97%   BMI 35.94 kg/m   No data found.  Physical Exam Vitals and nursing note reviewed.  Constitutional:      General: She is not in acute distress.    Appearance: Normal appearance.  HENT:     Head: Normocephalic and atraumatic.  Eyes:     Pupils: Pupils are equal, round, and reactive to light.  Cardiovascular:     Rate and Rhythm: Normal rate and regular rhythm.  Pulmonary:     Effort: Pulmonary effort is normal.     Breath sounds: Normal breath sounds.  Musculoskeletal:     Cervical back: Normal range of motion and neck supple.     Lumbar  back: Spasms and tenderness present. No swelling, deformity, signs of trauma or bony tenderness. Decreased range of motion. Positive right straight leg raise test and positive left straight leg raise test.  Skin:    General: Skin is warm and dry.  Neurological:     General: No focal deficit present.     Mental Status: She is alert and oriented to person, place, and time. Mental status is at baseline.  Psychiatric:        Mood and Affect: Mood  normal.        Behavior: Behavior normal.        Thought Content: Thought content normal.        Judgment: Judgment normal.     Visual Acuity Right Eye Distance:   Left Eye Distance:   Bilateral Distance:    Right Eye Near:   Left Eye Near:    Bilateral Near:     UC Couse / Diagnostics / Procedures:     Radiology No results found.  Procedures Procedures (including critical care time) EKG  Pending results:  Labs Reviewed  POCT URINALYSIS DIP (MANUAL ENTRY) - Abnormal; Notable for the following components:      Result Value   Clarity, UA cloudy (*)    pH, UA 8.5 (*)    Protein Ur, POC =30 (*)    All other components within normal limits    Medications Ordered in UC: Medications  ketorolac  (TORADOL ) 30 MG/ML injection 30 mg (30 mg Intramuscular Given 04/28/23 1040)  acetaminophen  (TYLENOL ) tablet 1,000 mg (1,000 mg Oral Given 04/28/23 1041)    UC Diagnoses / Final Clinical Impressions(s)   I have reviewed the triage vital signs and the nursing notes.  Pertinent labs & imaging results that were available during my care of the patient were reviewed by me and considered in my medical decision making (see chart for details).    Final diagnoses:  Acute bilateral low back pain with left-sided sciatica  Homelessness   Greater than 20 minutes was spent with the patient discussing available community resources such as [employer], [employer], [employer] consultant, several local churches that provide assistance with  homelessness.  Regrettably, patient states she was aware of nearly all of these programs and while she has sought assistance from some of them, overall they have not been sufficient to help her require permanent housing as of yet.  Patient states she is currently working with social services in [facility] and she is hopeful that she may be able to acquire housing within the next few months.  Dependent read of x-ray of lumbar spine revealed scoliosis, disc degeneration at L5-S1, abnormal lordosis and bone spurring at L2 and L3.  Patient advised.  Patient was provided with an injection of ketorolac  during her visit today for relief of pain and inflammation.  Patient was advised to begin Tylenol  1000 mg every 8 hours and baclofen 10 mg nightly.  Patient provided with a Medrol Dosepak to begin tomorrow morning.  Patient provided with information regarding sciatica and exercises for sciatica rehabilitation.  Urinalysis today was normal, no concern for urinary tract infection, urine culture not indicated.    Conservative care recommended.  Return precautions advised.  Please see discharge instructions below for details of plan of care as provided to patient. ED Prescriptions     Medication Sig Dispense Auth. Provider   acetaminophen  (TYLENOL ) 500 MG tablet Take 2 tablets (1,000 mg total) by mouth every 8 (eight) hours. 180 tablet Eloise Hake Scales, PA-C   baclofen (LIORESAL) 10 MG tablet Take 1 tablet (10 mg total) by mouth at bedtime for 7 days. 7 tablet Eloise Hake Scales, PA-C   methylPREDNISolone (MEDROL DOSEPAK) 4 MG TBPK tablet Take 24 mg on day 1, 20 mg on day 2, 16 mg on day 3, 12 mg on day 4, 8 mg on day 5, 4 mg on day 6.  Take all tablets in each row at once, do not spread tablets out throughout the day. 21 tablet Eloise Hake  Scales, PA-C      PDMP not reviewed this encounter.  Disposition Upon Discharge:  Condition: stable for discharge home  Patient presented with  concern for an acute illness with associated systemic symptoms and significant discomfort requiring urgent management. In my opinion, this is a condition that a prudent lay person (someone who possesses an average knowledge of health and medicine) may potentially expect to result in complications if not addressed urgently such as respiratory distress, impairment of bodily function or dysfunction of bodily organs.   As such, the patient has been evaluated and assessed, work-up was performed and treatment was provided in alignment with urgent care protocols and evidence based medicine.  Patient/parent/caregiver has been advised that the patient may require follow up for further testing and/or treatment if the symptoms continue in spite of treatment, as clinically indicated and appropriate.  Routine symptom specific, illness specific and/or disease specific instructions were discussed with the patient and/or caregiver at length.  Prevention strategies for avoiding STD exposure were also discussed.  The patient will follow up with their current PCP if and as advised. If the patient does not currently have a PCP we will assist them in obtaining one.   The patient may need specialty follow up if the symptoms continue, in spite of conservative treatment and management, for further workup, evaluation, consultation and treatment as clinically indicated and appropriate.  Patient/parent/caregiver verbalized understanding and agreement of plan as discussed.  All questions were addressed during visit.  Please see discharge instructions below for further details of plan.    Discharge Instructions      The results of the x-ray that we performed during your visit today showed fairly significant scoliosis in your lumbar spine, of which you may already be aware.  At your 2nd and 3rd lumbar vertebra, you have bone spurring which is a sign of arthritis.  You also have fairly significant degeneration of the disc between  your fifth lumbar vertebra and your first sacral vertebra, the soft cushion between those 2 bones is nearly gone.  Looking at the side view of your lumbar spine, I see that your fifth lumbar vertebra is out of alignment.  I believe this is most likely due to spasm of the muscles in your lumbar spine pulling it out of alignment.  I also believe this is the cause of your left-sided sciatic nerve pain at this time.   The mainstay of therapy for musculoskeletal pain is reduction of inflammation and relaxation of muscle tension which is causing nerve inflammation.  Keep in mind, pain always begets more pain.  To help you stay ahead of your pain and inflammation, I have provided the following regimen for you:   During your visit today, you received an injection of ketorolac , high-dose nonsteroidal anti-inflammatory pain medication that should significantly reduce your pain for the next 6 to 8 hours.   You were also provided with Tylenol  (acetaminophen ) 1,000 mg.  Please begin taking Tylenol  1,000 mg every 8 hours to keep your pain well controlled.   Tylenol  works best when taken on a scheduled basis.  Please know that the maximum allowable dose of Tylenol  is 4,000 mg in a 24 period so please rest assured that it is very safe for you to take 3,000 mg of Tylenol  in a 24-hour period to keep your back pain under control.   This evening, please begin taking baclofen 10 mg.  This is a highly effective muscle relaxer and antispasmodic which should continue to provide  you with relaxation of your tense muscles, allow you to sleep well and to keep your pain under control.   Tomorrow morning, please begin taking methylprednisolone.  Please take 1 full row tablets at once with your breakfast meal.  This will continue to keep your inflammation under control until your body can heal.   During the day, please set aside time to apply ice to the affected area at least 4 times daily for 20 minutes each time.   If you would  like to try to return return to urgent care in the next 2 to 3 days for repeat ketorolac  injection, you are welcome to do so.  Your urinalysis today was normal.  There is no concern for residual urinary tract infection.    I also recommend that you remain out of work for the next several days. I have provided you with a note to return to work in 3 days.  If you feel that you need this time extended, please follow-up with your primary care provider or return to urgent care for reevaluation so that we can provide you with a note for another 3 days.   Thank you for visiting [facility] today.  We appreciate the opportunity to participate in your care.       This office note has been dictated using Teaching laboratory technician.  Unfortunately, this method of dictation can sometimes lead to typographical or grammatical errors.  I apologize for your inconvenience in advance if this occurs.  Please do not hesitate to reach out to me if clarification is needed.       Eloise Hake Scales, PA-C 04/28/23 1127    Eloise Hake Scales, PA-C 04/28/23 1128

## 2023-04-28 NOTE — ED Triage Notes (Signed)
 Patient states she was seen 4/22 for UTI and symptoms not getting better.  Having chills, flank pain, painful urination

## 2023-04-28 NOTE — Discharge Instructions (Addendum)
 The results of the x-ray that we performed during your visit today showed fairly significant scoliosis in your lumbar spine, of which you may already be aware.  At your 2nd and 3rd lumbar vertebra, you have bone spurring which is a sign of arthritis.  You also have fairly significant degeneration of the disc between your fifth lumbar vertebra and your first sacral vertebra, the soft cushion between those 2 bones is nearly gone.  Looking at the side view of your lumbar spine, I see that your fifth lumbar vertebra is out of alignment.  I believe this is most likely due to spasm of the muscles in your lumbar spine pulling it out of alignment.  I also believe this is the cause of your left-sided sciatic nerve pain at this time.   The mainstay of therapy for musculoskeletal pain is reduction of inflammation and relaxation of muscle tension which is causing nerve inflammation.  Keep in mind, pain always begets more pain.  To help you stay ahead of your pain and inflammation, I have provided the following regimen for you:   During your visit today, you received an injection of ketorolac , high-dose nonsteroidal anti-inflammatory pain medication that should significantly reduce your pain for the next 6 to 8 hours.   You were also provided with Tylenol  (acetaminophen ) 1,000 mg.  Please begin taking Tylenol  1,000 mg every 8 hours to keep your pain well controlled.   Tylenol  works best when taken on a scheduled basis.  Please know that the maximum allowable dose of Tylenol  is 4,000 mg in a 24 period so please rest assured that it is very safe for you to take 3,000 mg of Tylenol  in a 24-hour period to keep your back pain under control.   This evening, please begin taking baclofen 10 mg.  This is a highly effective muscle relaxer and antispasmodic which should continue to provide you with relaxation of your tense muscles, allow you to sleep well and to keep your pain under control.   Tomorrow morning, please begin  taking methylprednisolone.  Please take 1 full row tablets at once with your breakfast meal.  This will continue to keep your inflammation under control until your body can heal.   During the day, please set aside time to apply ice to the affected area at least 4 times daily for 20 minutes each time.   If you would like to try to return return to urgent care in the next 2 to 3 days for repeat ketorolac  injection, you are welcome to do so.  Your urinalysis today was normal.  There is no concern for residual urinary tract infection.    I also recommend that you remain out of work for the next several days. I have provided you with a note to return to work in 3 days.  If you feel that you need this time extended, please follow-up with your primary care provider or return to urgent care for reevaluation so that we can provide you with a note for another 3 days.   Thank you for visiting [facility] today.  We appreciate the opportunity to participate in your care.

## 2023-05-16 ENCOUNTER — Ambulatory Visit: Payer: Medicaid Other | Admitting: Urology

## 2023-06-26 ENCOUNTER — Ambulatory Visit
Admission: RE | Admit: 2023-06-26 | Discharge: 2023-06-26 | Disposition: A | Discharge status: Home / Self Care | Source: Ambulatory Visit | Attending: Family Medicine | Admitting: Family Medicine

## 2023-06-26 ENCOUNTER — Other Ambulatory Visit: Payer: Self-pay | Admitting: Family Medicine

## 2023-06-26 DIAGNOSIS — Z1231 Encounter for screening mammogram for malignant neoplasm of breast: Secondary | ICD-10-CM

## 2023-08-21 ENCOUNTER — Encounter (HOSPITAL_BASED_OUTPATIENT_CLINIC_OR_DEPARTMENT_OTHER): Payer: Self-pay

## 2023-08-21 ENCOUNTER — Emergency Department (HOSPITAL_BASED_OUTPATIENT_CLINIC_OR_DEPARTMENT_OTHER)
Admission: EM | Admit: 2023-08-21 | Discharge: 2023-08-21 | Disposition: A | Discharge status: Home / Self Care | Attending: Emergency Medicine | Admitting: Emergency Medicine

## 2023-08-21 ENCOUNTER — Other Ambulatory Visit: Payer: Self-pay

## 2023-08-21 ENCOUNTER — Emergency Department (HOSPITAL_BASED_OUTPATIENT_CLINIC_OR_DEPARTMENT_OTHER)

## 2023-08-21 DIAGNOSIS — Z7901 Long term (current) use of anticoagulants: Secondary | ICD-10-CM | POA: Diagnosis not present

## 2023-08-21 DIAGNOSIS — R5383 Other fatigue: Secondary | ICD-10-CM | POA: Diagnosis not present

## 2023-08-21 DIAGNOSIS — Z5900 Homelessness unspecified: Secondary | ICD-10-CM | POA: Diagnosis not present

## 2023-08-21 DIAGNOSIS — R42 Dizziness and giddiness: Secondary | ICD-10-CM | POA: Insufficient documentation

## 2023-08-21 DIAGNOSIS — R059 Cough, unspecified: Secondary | ICD-10-CM | POA: Insufficient documentation

## 2023-08-21 LAB — CBC WITH DIFFERENTIAL/PLATELET
Abs Immature Granulocytes: 0.02 K/uL (ref 0.00–0.07)
Basophils Absolute: 0 K/uL (ref 0.0–0.1)
Basophils Relative: 1 %
Eosinophils Absolute: 0.2 K/uL (ref 0.0–0.5)
Eosinophils Relative: 4 %
HCT: 35.3 % — ABNORMAL LOW (ref 36.0–46.0)
Hemoglobin: 11.9 g/dL — ABNORMAL LOW (ref 12.0–15.0)
Immature Granulocytes: 0 %
Lymphocytes Relative: 21 %
Lymphs Abs: 1.1 K/uL (ref 0.7–4.0)
MCH: 31 pg (ref 26.0–34.0)
MCHC: 33.7 g/dL (ref 30.0–36.0)
MCV: 91.9 fL (ref 80.0–100.0)
Monocytes Absolute: 0.4 K/uL (ref 0.1–1.0)
Monocytes Relative: 7 %
Neutro Abs: 3.4 K/uL (ref 1.7–7.7)
Neutrophils Relative %: 67 %
Platelets: 282 K/uL (ref 150–400)
RBC: 3.84 MIL/uL — ABNORMAL LOW (ref 3.87–5.11)
RDW: 13.3 % (ref 11.5–15.5)
WBC: 5.1 K/uL (ref 4.0–10.5)
nRBC: 0 % (ref 0.0–0.2)

## 2023-08-21 LAB — COMPREHENSIVE METABOLIC PANEL WITH GFR
ALT: 21 U/L (ref 0–44)
AST: 25 U/L (ref 15–41)
Albumin: 3.9 g/dL (ref 3.5–5.0)
Alkaline Phosphatase: 97 U/L (ref 38–126)
Anion gap: 9 (ref 5–15)
BUN: 13 mg/dL (ref 6–20)
CO2: 27 mmol/L (ref 22–32)
Calcium: 8.8 mg/dL — ABNORMAL LOW (ref 8.9–10.3)
Chloride: 106 mmol/L (ref 98–111)
Creatinine, Ser: 1.02 mg/dL — ABNORMAL HIGH (ref 0.44–1.00)
GFR, Estimated: >60 mL/min (ref >60)
Glucose, Bld: 91 mg/dL (ref 70–99)
Potassium: 3.7 mmol/L (ref 3.5–5.1)
Sodium: 142 mmol/L (ref 135–145)
Total Bilirubin: 0.7 mg/dL (ref 0.0–1.2)
Total Protein: 6.1 g/dL — ABNORMAL LOW (ref 6.5–8.1)

## 2023-08-21 MED ORDER — SODIUM CHLORIDE 0.9 % IV BOLUS
1000.0000 mL | Freq: Once | INTRAVENOUS | Status: AC
  Administered 2023-08-21: 1000 mL via INTRAVENOUS

## 2023-08-21 MED ORDER — DEXAMETHASONE 4 MG PO TABS
10.0000 mg | ORAL_TABLET | Freq: Once | ORAL | Status: AC
  Administered 2023-08-21: 10 mg via ORAL
  Filled 2023-08-21: qty 3

## 2023-08-21 NOTE — ED Triage Notes (Addendum)
 Pt reports being homeless and just recently found housing. Mold in house. Coughing, wheezing HA, disoriented  Coughed up blood x 2 this am. On Eliquis  for hx of PE

## 2023-08-21 NOTE — Discharge Instructions (Addendum)
 Your x-ray and blood work looks okay.  Please follow-up with your family doctor in the office.  Please return for worsening symptoms worsening difficulty breathing or if you feel you are in a pass out

## 2023-08-21 NOTE — ED Provider Notes (Signed)
 [place] [facility] AT [facility] Provider Note   CSN: [id number] Arrival date & time: 08/21/23  9063     Patient presents with: Hemoptysis   Carmen Barr is a 58 y.o. female.   58 yo F with a chief complaints of cough fatigue lightheadedness.  Going on for about a month or so.  She thinks is due to the mold in her house.  She was homeless and had recently moved into an apartment.  She thinks that the apartment has mold and is contacted her landlord to try and get it fixed.  She had some pinkish sputum this morning and was worried and came into the emergency department for evaluation.        Prior to Admission medications   Medication Sig Start Date End Date Taking? Authorizing Provider  apixaban  (ELIQUIS ) 5 MG TABS tablet Take 1 tablet (5 mg total) by mouth 2 (two) times daily. Start after completing starter kit 02/27/23     citalopram  (CELEXA ) 20 MG tablet Take 1 tablet (20 mg total) by mouth daily. 12/17/22 03/17/23  [place], Camellia PARAS, DO  escitalopram  (LEXAPRO ) 20 MG tablet Take 20 mg by mouth daily. 03/30/23   [provider]  esomeprazole  (NEXIUM ) 20 MG capsule Take 1 capsule (20 mg total) by mouth daily at 12 noon. 12/17/22 03/17/23  [place], Eric J, DO  hydrochlorothiazide  (HYDRODIURIL ) 25 MG tablet Take 1 tablet (25 mg total) by mouth daily. 12/17/22 04/23/23  [place], Eric J, DO  methylPREDNISolone  (MEDROL  DOSEPAK) 4 MG TBPK tablet Take 24 mg on day 1, 20 mg on day 2, 16 mg on day 3, 12 mg on day 4, 8 mg on day 5, 4 mg on day 6.  Take all tablets in each row at once, do not spread tablets out throughout the day. 04/28/23   Joesph Shaver Scales, PA-C    Allergies: Percocet [oxycodone-acetaminophen ]    Review of Systems  Updated Vital Signs BP (!) 154/99   Pulse 60   Temp 98.7 F (37.1 C)   Resp 18   Ht 6' (1.829 m)   Wt 73.5 kg   SpO2 100%   BMI 21.97 kg/m   Physical Exam Vitals and nursing note reviewed.  Constitutional:       General: She is not in acute distress.    Appearance: She is well-developed. She is not diaphoretic.  HENT:     Head: Normocephalic and atraumatic.  Eyes:     Pupils: Pupils are equal, round, and reactive to light.  Cardiovascular:     Rate and Rhythm: Normal rate and regular rhythm.     Heart sounds: No murmur heard.    No friction rub. No gallop.  Pulmonary:     Effort: Pulmonary effort is normal.     Breath sounds: No wheezing or rales.  Abdominal:     General: There is no distension.     Palpations: Abdomen is soft.     Tenderness: There is no abdominal tenderness.  Musculoskeletal:        General: No tenderness.     Cervical back: Normal range of motion and neck supple.  Skin:    General: Skin is warm and dry.  Neurological:     Mental Status: She is alert and oriented to person, place, and time.  Psychiatric:        Behavior: Behavior normal.     (all labs ordered are listed, but only abnormal results are displayed) Labs Reviewed  CBC WITH DIFFERENTIAL/PLATELET - Abnormal; Notable for the following components:      Result Value   RBC 3.84 (*)    Hemoglobin 11.9 (*)    HCT 35.3 (*)    All other components within normal limits  COMPREHENSIVE METABOLIC PANEL WITH GFR - Abnormal; Notable for the following components:   Creatinine, Ser 1.02 (*)    Calcium 8.8 (*)    Total Protein 6.1 (*)    All other components within normal limits    EKG: EKG Interpretation Date/Time:  Wednesday August 21 2023 09:50:49 EDT Ventricular Rate:  68 PR Interval:    QRS Duration:  91 QT Interval:  416 QTC Calculation: 423 R Axis:   33  Text Interpretation: Atrial fibrillation Low voltage, precordial leads Abnormal R-wave progression, early transition Nonspecific T abnormalities, inferior leads No significant change since last tracing Confirmed by Emil Share [phone]) on 08/21/2023 9:51:22 AM  Radiology: [facility] Chest Port 1 View Result Date: 08/21/2023 CLINICAL DATA:  Shortness of  breath cough, wheezing EXAM: PORTABLE CHEST 1 VIEW COMPARISON:  02/23/2023 FINDINGS: Mild enlargement of the cardiopericardial silhouette. Low lung volumes are present, causing crowding of the pulmonary vasculature. The lungs appear clear. No blunting of the costophrenic angles. Advanced bilateral degenerative glenohumeral arthropathy. Mild dextroconvex thoracic scoliosis. IMPRESSION: 1. Mild enlargement of the cardiopericardial silhouette, without edema. 2. Advanced bilateral degenerative glenohumeral arthropathy. 3. Mild dextroconvex thoracic scoliosis. Electronically Signed   By: Ryan Salvage M.D.   On: 08/21/2023 10:35     Procedures   Medications Ordered in the ED  sodium chloride  0.9 % bolus 1,000 mL (0 mLs Intravenous Stopped 08/21/23 1050)  dexamethasone  (DECADRON ) tablet 10 mg (10 mg Oral Given 08/21/23 1046)                                    Medical Decision Making Amount and/or Complexity of Data Reviewed Labs: ordered. Radiology: ordered.  Risk Prescription drug management.   58 yo F with a chief complaints of cough congestion dizziness.  Tells me that she has had some increased discoloration to her sputum as well.  Has a history of PE and is on anticoagulation.  She denies any missed dosages.  She is well-appearing and nontoxic.  Clear lung sounds for me.  No tachycardia no hypoxia.  Will obtain a chest x-ray blood work.  Chest x-ray independently interpreted by me without focal infiltrate or pneumothorax.  No acute anemia no significant electrolyte abnormalities.  Patient feeling mildly better after fluids.  Will discharge home.  PCP follow-up.  11:27 AM:  I have discussed the diagnosis/risks/treatment options with the patient.  Evaluation and diagnostic testing in the emergency department does not suggest an emergent condition requiring admission or immediate intervention beyond what has been performed at this time.  They will follow up with PCP. We also discussed  returning to the ED immediately if new or worsening sx occur. We discussed the sx which are most concerning (e.g., sudden worsening pain, fever, inability to tolerate by mouth) that necessitate immediate return. Medications administered to the patient during their visit and any new prescriptions provided to the patient are listed below.  Medications given during this visit Medications  sodium chloride  0.9 % bolus 1,000 mL (0 mLs Intravenous Stopped 08/21/23 1050)  dexamethasone  (DECADRON ) tablet 10 mg (10 mg Oral Given 08/21/23 1046)     The patient appears reasonably screen and/or stabilized for discharge  and I doubt any other medical condition or other [facility] requiring further screening, evaluation, or treatment in the ED at this time prior to discharge.       Final diagnoses:  Cough in adult    ED Discharge Orders     None          Emil Share, DO 08/21/23 1128

## 2023-10-11 ENCOUNTER — Emergency Department (HOSPITAL_BASED_OUTPATIENT_CLINIC_OR_DEPARTMENT_OTHER)

## 2023-10-11 ENCOUNTER — Encounter (HOSPITAL_BASED_OUTPATIENT_CLINIC_OR_DEPARTMENT_OTHER): Payer: Self-pay | Admitting: Emergency Medicine

## 2023-10-11 ENCOUNTER — Emergency Department (HOSPITAL_BASED_OUTPATIENT_CLINIC_OR_DEPARTMENT_OTHER): Admission: EM | Admit: 2023-10-11 | Discharge: 2023-10-11 | Disposition: A | Discharge status: Home / Self Care

## 2023-10-11 ENCOUNTER — Other Ambulatory Visit: Payer: Self-pay

## 2023-10-11 DIAGNOSIS — Z79899 Other long term (current) drug therapy: Secondary | ICD-10-CM | POA: Diagnosis not present

## 2023-10-11 DIAGNOSIS — R0602 Shortness of breath: Secondary | ICD-10-CM | POA: Insufficient documentation

## 2023-10-11 DIAGNOSIS — R519 Headache, unspecified: Secondary | ICD-10-CM | POA: Insufficient documentation

## 2023-10-11 DIAGNOSIS — Z5900 Homelessness unspecified: Secondary | ICD-10-CM | POA: Diagnosis not present

## 2023-10-11 DIAGNOSIS — W19XXXA Unspecified fall, initial encounter: Secondary | ICD-10-CM | POA: Insufficient documentation

## 2023-10-11 DIAGNOSIS — I10 Essential (primary) hypertension: Secondary | ICD-10-CM | POA: Insufficient documentation

## 2023-10-11 DIAGNOSIS — Z7901 Long term (current) use of anticoagulants: Secondary | ICD-10-CM | POA: Insufficient documentation

## 2023-10-11 MED ORDER — LIDOCAINE 5 % EX PTCH: 1.0000 | MEDICATED_PATCH | CUTANEOUS | 0 refills | Status: AC

## 2023-10-11 MED ORDER — DICLOFENAC SODIUM 1 % EX GEL: 4.0000 g | Freq: Four times a day (QID) | CUTANEOUS | 0 refills | Status: AC

## 2023-10-11 MED ORDER — HYDROCODONE-ACETAMINOPHEN 10-325 MG PO TABS
1.0000 | ORAL_TABLET | Freq: Once | ORAL | Status: DC
Start: 2023-10-11 — End: 2023-10-11

## 2023-10-11 MED ORDER — HYDROCODONE-ACETAMINOPHEN 5-325 MG PO TABS
1.0000 | ORAL_TABLET | Freq: Once | ORAL | Status: DC
  Filled 2023-10-11: qty 1

## 2023-10-11 NOTE — ED Notes (Signed)
 In to discharge the Pt. And Pt. Is in no distress on her phone Pt. Acts as if she is being un cared for on the phone with some one and I explained that I can not speak with anyone due to HIPAA and she cont to argue and RN Nicholaus just listened to her.  Pt. Puts phone on mute and makes comment to RN that  F------ white bitch then puts phone back off mute and tells the other end that she believes this this is all racial on her.  Pt. Is continuously talking aloud to herself and has been ugly to the Charge RN Ikrame and made Racial slurs to her as well as made it clear that she did not want her in her room taking care of her.  Pt. Then was verbally ugly to [facility] RN who tried to D/C her after she would not allow RN Nicholaus back into the room.  The Pt. Had to escorted out by security at time of discharge.

## 2023-10-11 NOTE — ED Provider Notes (Signed)
 [place] [facility] Provider Note   CSN: [id number] Arrival date & time: 10/11/23  9196     Patient presents with: Carmen Barr is a 58 y.o. female.   57 year old female with past medical history of hypertension and homelessness as well as pulmonary embolism in the past on Eliquis  presenting to the emergency department today complaining of pain in her right side after a fall.  Patient states that she tripped and fell on her right side a few days ago.  She has been having pain since.  Is reporting some mild shortness of breath with this.  She is unsure if she hit her head but did not lose consciousness.  She is on Eliquis .  Patient is also complaining of mold exposure in her apartment.   Fall       Prior to Admission medications   Medication Sig Start Date End Date Taking? Authorizing Provider  diclofenac Sodium (VOLTAREN) 1 % GEL Apply 4 g topically 4 (four) times daily. 10/11/23  Yes Ula Prentice SAUNDERS, MD  lidocaine  (LIDODERM ) 5 % Place 1 patch onto the skin daily. Remove & Discard patch within 12 hours or as directed by MD 10/11/23  Yes Ula Prentice SAUNDERS, MD  apixaban  (ELIQUIS ) 5 MG TABS tablet Take 1 tablet (5 mg total) by mouth 2 (two) times daily. Start after completing starter kit 02/27/23     citalopram  (CELEXA ) 20 MG tablet Take 1 tablet (20 mg total) by mouth daily. 12/17/22 03/17/23  [place], Eric J, DO  escitalopram  (LEXAPRO ) 20 MG tablet Take 20 mg by mouth daily. 03/30/23   [provider]  esomeprazole  (NEXIUM ) 20 MG capsule Take 1 capsule (20 mg total) by mouth daily at 12 noon. 12/17/22 03/17/23  [place], Eric J, DO  hydrochlorothiazide  (HYDRODIURIL ) 25 MG tablet Take 1 tablet (25 mg total) by mouth daily. 12/17/22 04/23/23  [place], Camellia PARAS, DO  methylPREDNISolone  (MEDROL  DOSEPAK) 4 MG TBPK tablet Take 24 mg on day 1, 20 mg on day 2, 16 mg on day 3, 12 mg on day 4, 8 mg on day 5, 4 mg on day 6.  Take all tablets in  each row at once, do not spread tablets out throughout the day. 04/28/23   Joesph Shaver Scales, PA-C    Allergies: Percocet [oxycodone-acetaminophen ]    Review of Systems  Musculoskeletal:  Positive for arthralgias.  All other systems reviewed and are negative.   Updated Vital Signs BP (!) 152/91   Pulse 79   Temp 97.9 F (36.6 C) (Oral)   Resp 18   Wt 113.4 kg   SpO2 99%   BMI 33.91 kg/m   Physical Exam Vitals and nursing note reviewed.   Gen: NAD Eyes: PERRL, EOMI HEENT: no oropharyngeal swelling Neck: trachea midline, no cervical spine tenderness, no stepoffs or deformities Resp: clear to auscultation bilaterally, tender over the right lateral chest wall Card: RRR, no murmurs, rubs, or gallops Abd: nontender, nondistended, no seatbelt sign Extremities: no calf tenderness, no edema MSK: no thoracic spinal tenderness, no lumbar spinal tenderness, no step-offs or deformities Vascular: 2+ radial pulses bilaterally, 2+ DP pulses bilaterally Neuro: Alert and oriented x 3, equal strength sensation throughout bilateral upper and lower extremities Skin: no rashes    (all labs ordered are listed, but only abnormal results are displayed) Labs Reviewed - No data to display  EKG: None  Radiology: DG Forearm Right Result Date: 10/11/2023 EXAM: 2 VIEW(S) XRAY OF THE  RIGHT FOREARM 10/11/2023 10:28:00 AM COMPARISON: None available. CLINICAL HISTORY: Fall 3 days ago, bilateral side pain, pain all over. Multiple complaints, agitated, upset, tearful, emotional. FINDINGS: FINDINGS: BONES AND JOINTS: No acute fracture. No focal osseous lesion. No joint dislocation. SOFT TISSUES: The soft tissues are unremarkable. IMPRESSION: 1. No acute fracture or dislocation. Electronically signed by: Katheleen Faes MD 10/11/2023 10:44 AM EDT RP Workstation: [id number]   CT Head Wo Contrast Result Date: 10/11/2023 EXAM: CT HEAD AND CERVICAL SPINE 10/11/2023 09:47:13 AM TECHNIQUE: CT of the head  and cervical spine was performed without the administration of intravenous contrast. Multiplanar reformatted images are provided for review. Automated exposure control, iterative reconstruction, and/or weight based adjustment of the mA/kV was utilized to reduce the radiation dose to as low as reasonably achievable. COMPARISON: CT head and cervical spine 02/23/2023. CLINICAL HISTORY: Head and neck trauma, coagulopathy, fall 3 days ago, bilateral side pain, pain all over. FINDINGS: CT HEAD BRAIN AND VENTRICLES: There is no evidence of an acute infarct, intracranial hemorrhage, mass, midline shift, hydrocephalus, or extra-axial fluid collection. Cerebral volume is within normal limits for age. The ventricles are normal in size. Symmetric bilateral basal ganglia calcifications are unchanged. A mildly enlarged partially empty sella is unchanged. ORBITS: No acute abnormality. SINUSES AND MASTOIDS: Small mucosal retention cysts in the right maxillary sinus. Clear mastoid air cells. SOFT TISSUES AND SKULL: No acute skull fracture or soft tissue abnormality. CT CERVICAL SPINE BONES AND ALIGNMENT: No acute fracture or traumatic malalignment. DEGENERATIVE CHANGES: Disc space narrowing and degenerative endplate changes throughout the cervical spine with exception of C2-C3. Moderate neural foraminal stenosis on the right at C3-C4 and bilaterally at C5-C6 and C6-C7. No evidence of high grade spinal canal stenosis. Mild multilevel facet arthrosis. SOFT TISSUES: No prevertebral soft tissue swelling. IMPRESSION: 1. No acute intracranial abnormality. 2. No acute fracture or traumatic malalignment of the cervical spine. Electronically signed by: Dasie Hamburg MD 10/11/2023 10:03 AM EDT RP Workstation: [id number]   CT Cervical Spine Wo Contrast Result Date: 10/11/2023 EXAM: CT HEAD AND CERVICAL SPINE 10/11/2023 09:47:13 AM TECHNIQUE: CT of the head and cervical spine was performed without the administration of intravenous contrast.  Multiplanar reformatted images are provided for review. Automated exposure control, iterative reconstruction, and/or weight based adjustment of the mA/kV was utilized to reduce the radiation dose to as low as reasonably achievable. COMPARISON: CT head and cervical spine 02/23/2023. CLINICAL HISTORY: Head and neck trauma, coagulopathy, fall 3 days ago, bilateral side pain, pain all over. FINDINGS: CT HEAD BRAIN AND VENTRICLES: There is no evidence of an acute infarct, intracranial hemorrhage, mass, midline shift, hydrocephalus, or extra-axial fluid collection. Cerebral volume is within normal limits for age. The ventricles are normal in size. Symmetric bilateral basal ganglia calcifications are unchanged. A mildly enlarged partially empty sella is unchanged. ORBITS: No acute abnormality. SINUSES AND MASTOIDS: Small mucosal retention cysts in the right maxillary sinus. Clear mastoid air cells. SOFT TISSUES AND SKULL: No acute skull fracture or soft tissue abnormality. CT CERVICAL SPINE BONES AND ALIGNMENT: No acute fracture or traumatic malalignment. DEGENERATIVE CHANGES: Disc space narrowing and degenerative endplate changes throughout the cervical spine with exception of C2-C3. Moderate neural foraminal stenosis on the right at C3-C4 and bilaterally at C5-C6 and C6-C7. No evidence of high grade spinal canal stenosis. Mild multilevel facet arthrosis. SOFT TISSUES: No prevertebral soft tissue swelling. IMPRESSION: 1. No acute intracranial abnormality. 2. No acute fracture or traumatic malalignment of the cervical spine. Electronically signed by: Dasie Hamburg MD  10/11/2023 10:03 AM EDT RP Workstation: [id number]   DG Chest Portable 1 View Result Date: 10/11/2023 CLINICAL DATA:  Diffuse pain following a fall. EXAM: PORTABLE CHEST 1 VIEW COMPARISON:  08/21/2023 FINDINGS: Poor inspiration. Normal sized heart. Clear lungs with normal vascularity. Moderate to marked bilateral glenohumeral joint degenerative changes. No  fracture or pneumothorax seen. IMPRESSION: No acute abnormality. Electronically Signed   By: Elspeth Bathe M.D.   On: 10/11/2023 09:48     Procedures   Medications Ordered in the ED  HYDROcodone -acetaminophen  (NORCO) 10-325 MG per tablet 1 tablet (has no administration in time range)                                    Medical Decision Making 58 year old female with past medical history of hypertension and pulmonary embolism on Eliquis  presenting to the emergency department today with pain in her right chest wall along with some shortness of breath after a mechanical fall at home.  I will further evaluate patient here with a CT scan of her head and cervical spine with her being on Eliquis .  Will obtain x-ray of her chest here to evaluate for pulmonary edema, pulmonary infiltrates, or pneumothorax as well as hemothorax.  Patient otherwise hemodynamically stable.  Think that if this is negative she could be discharged.  The patient's work appears reassuring.  Vital signs remained stable.  She will be discharged with return precautions.  Amount and/or Complexity of Data Reviewed Radiology: ordered.  Risk Prescription drug management.        Final diagnoses:  Fall, initial encounter    ED Discharge Orders          Ordered    diclofenac Sodium (VOLTAREN) 1 % GEL  4 times daily        10/11/23 1104    lidocaine  (LIDODERM ) 5 %  Every 24 hours        10/11/23 1104               Ula Prentice SAUNDERS, MD 10/11/23 1105

## 2023-10-11 NOTE — Discharge Instructions (Signed)
 Your workup today was reassuring.  Please try the Voltaren gel and lighted Derm patches in addition to your pain medication at home.  Follow-up with your doctor and return to the ER for worsening symptoms.

## 2023-10-11 NOTE — ED Triage Notes (Signed)
 Fell 3 days ago , bilateral side pain , pain all over . Pt has multiple complaints , pt is homeless she said . Pt is agitated and appears upset , tearful and emotional .  Word salad .
